# Patient Record
Sex: Female | Born: 1970 | Race: White | Hispanic: No | Marital: Married | Smoking: Former smoker
Health system: Southern US, Community
[De-identification: ages and names within clinical notes are randomized; demographics above are authoritative.]

## PROBLEM LIST (undated history)

## (undated) DIAGNOSIS — R5383 Other fatigue: Secondary | ICD-10-CM

## (undated) DIAGNOSIS — N941 Unspecified dyspareunia: Secondary | ICD-10-CM

## (undated) DIAGNOSIS — N6310 Unspecified lump in the right breast, unspecified quadrant: Secondary | ICD-10-CM

## (undated) DIAGNOSIS — M545 Low back pain: Secondary | ICD-10-CM

## (undated) DIAGNOSIS — K589 Irritable bowel syndrome without diarrhea: Secondary | ICD-10-CM

## (undated) HISTORY — DX: Low back pain: M54.5

## (undated) HISTORY — DX: Irritable bowel syndrome, unspecified: K58.9

## (undated) HISTORY — DX: Unspecified lump in the right breast, unspecified quadrant: N63.10

## (undated) HISTORY — DX: Unspecified dyspareunia: N94.10

## (undated) HISTORY — DX: Other fatigue: R53.83

---

## 2001-02-18 ENCOUNTER — Other Ambulatory Visit: Admission: RE | Admit: 2001-02-18 | Discharge: 2001-02-18 | Payer: Self-pay | Admitting: Obstetrics and Gynecology

## 2004-04-07 ENCOUNTER — Ambulatory Visit (HOSPITAL_COMMUNITY): Admission: RE | Admit: 2004-04-07 | Discharge: 2004-04-07 | Payer: Self-pay | Admitting: Obstetrics and Gynecology

## 2004-04-18 ENCOUNTER — Ambulatory Visit (HOSPITAL_COMMUNITY): Admission: RE | Admit: 2004-04-18 | Discharge: 2004-04-18 | Payer: Self-pay | Admitting: Obstetrics & Gynecology

## 2004-04-20 ENCOUNTER — Ambulatory Visit (HOSPITAL_COMMUNITY): Admission: RE | Admit: 2004-04-20 | Discharge: 2004-04-20 | Payer: Self-pay | Admitting: General Surgery

## 2004-07-24 HISTORY — PX: LAPAROSCOPIC CHOLECYSTECTOMY: SUR755

## 2006-04-16 ENCOUNTER — Ambulatory Visit: Payer: Self-pay | Admitting: Gastroenterology

## 2006-04-26 ENCOUNTER — Encounter (HOSPITAL_COMMUNITY): Admission: RE | Admit: 2006-04-26 | Discharge: 2006-05-26 | Payer: Self-pay | Admitting: Gastroenterology

## 2006-05-17 ENCOUNTER — Ambulatory Visit: Payer: Self-pay | Admitting: Gastroenterology

## 2006-05-17 ENCOUNTER — Ambulatory Visit (HOSPITAL_COMMUNITY): Admission: RE | Admit: 2006-05-17 | Discharge: 2006-05-17 | Payer: Self-pay | Admitting: Gastroenterology

## 2006-05-29 ENCOUNTER — Ambulatory Visit (HOSPITAL_COMMUNITY): Admission: RE | Admit: 2006-05-29 | Discharge: 2006-05-29 | Payer: Self-pay | Admitting: Gastroenterology

## 2006-05-29 ENCOUNTER — Ambulatory Visit: Payer: Self-pay | Admitting: Gastroenterology

## 2010-08-14 ENCOUNTER — Encounter: Payer: Self-pay | Admitting: Obstetrics & Gynecology

## 2010-10-04 ENCOUNTER — Other Ambulatory Visit (HOSPITAL_COMMUNITY)
Admission: RE | Admit: 2010-10-04 | Discharge: 2010-10-04 | Disposition: A | Discharge status: Home / Self Care | Payer: BC Managed Care – PPO | Source: Ambulatory Visit | Attending: Obstetrics and Gynecology | Admitting: Obstetrics and Gynecology

## 2010-10-04 ENCOUNTER — Other Ambulatory Visit: Payer: Self-pay | Admitting: Adult Health

## 2010-10-04 ENCOUNTER — Other Ambulatory Visit: Payer: Self-pay | Admitting: Obstetrics & Gynecology

## 2010-10-04 DIAGNOSIS — N6310 Unspecified lump in the right breast, unspecified quadrant: Secondary | ICD-10-CM

## 2010-10-04 DIAGNOSIS — Z01419 Encounter for gynecological examination (general) (routine) without abnormal findings: Secondary | ICD-10-CM | POA: Insufficient documentation

## 2010-10-12 ENCOUNTER — Other Ambulatory Visit: Payer: Self-pay | Admitting: Obstetrics & Gynecology

## 2010-10-12 ENCOUNTER — Ambulatory Visit (HOSPITAL_COMMUNITY)
Admission: RE | Admit: 2010-10-12 | Discharge: 2010-10-12 | Disposition: A | Discharge status: Home / Self Care | Payer: BC Managed Care – PPO | Source: Ambulatory Visit | Attending: Obstetrics & Gynecology | Admitting: Obstetrics & Gynecology

## 2010-10-12 DIAGNOSIS — N6310 Unspecified lump in the right breast, unspecified quadrant: Secondary | ICD-10-CM

## 2010-10-12 DIAGNOSIS — N63 Unspecified lump in unspecified breast: Secondary | ICD-10-CM | POA: Insufficient documentation

## 2010-11-11 ENCOUNTER — Other Ambulatory Visit (HOSPITAL_COMMUNITY): Payer: Self-pay | Admitting: Pediatrics

## 2010-11-11 ENCOUNTER — Ambulatory Visit (HOSPITAL_COMMUNITY)
Admission: RE | Admit: 2010-11-11 | Discharge: 2010-11-11 | Disposition: A | Discharge status: Home / Self Care | Payer: BC Managed Care – PPO | Source: Ambulatory Visit | Attending: Pediatrics | Admitting: Pediatrics

## 2010-11-11 DIAGNOSIS — R1031 Right lower quadrant pain: Secondary | ICD-10-CM | POA: Insufficient documentation

## 2010-11-11 DIAGNOSIS — R9389 Abnormal findings on diagnostic imaging of other specified body structures: Secondary | ICD-10-CM | POA: Insufficient documentation

## 2010-11-11 DIAGNOSIS — R3129 Other microscopic hematuria: Secondary | ICD-10-CM | POA: Insufficient documentation

## 2010-11-16 ENCOUNTER — Other Ambulatory Visit (HOSPITAL_COMMUNITY): Payer: Self-pay | Admitting: Pediatrics

## 2010-11-16 DIAGNOSIS — IMO0002 Reserved for concepts with insufficient information to code with codable children: Secondary | ICD-10-CM

## 2010-11-17 ENCOUNTER — Other Ambulatory Visit (HOSPITAL_COMMUNITY): Payer: Self-pay | Admitting: Pediatrics

## 2010-11-17 ENCOUNTER — Ambulatory Visit (HOSPITAL_COMMUNITY)
Admission: RE | Admit: 2010-11-17 | Discharge: 2010-11-17 | Disposition: A | Discharge status: Home / Self Care | Payer: BC Managed Care – PPO | Source: Ambulatory Visit | Attending: Pediatrics | Admitting: Pediatrics

## 2010-11-17 DIAGNOSIS — R1903 Right lower quadrant abdominal swelling, mass and lump: Secondary | ICD-10-CM | POA: Insufficient documentation

## 2010-11-17 DIAGNOSIS — IMO0002 Reserved for concepts with insufficient information to code with codable children: Secondary | ICD-10-CM

## 2010-11-17 DIAGNOSIS — R9389 Abnormal findings on diagnostic imaging of other specified body structures: Secondary | ICD-10-CM | POA: Insufficient documentation

## 2010-12-02 ENCOUNTER — Other Ambulatory Visit (HOSPITAL_COMMUNITY): Payer: BC Managed Care – PPO

## 2010-12-05 ENCOUNTER — Other Ambulatory Visit: Payer: Self-pay | Admitting: Obstetrics & Gynecology

## 2010-12-05 ENCOUNTER — Encounter (HOSPITAL_COMMUNITY): Payer: BC Managed Care – PPO

## 2010-12-05 LAB — COMPREHENSIVE METABOLIC PANEL
Alkaline Phosphatase: 63 U/L (ref 39–117)
BUN: 11 mg/dL (ref 6–23)
Creatinine, Ser: 0.58 mg/dL (ref 0.4–1.2)
Glucose, Bld: 88 mg/dL (ref 70–99)
Potassium: 4.3 mEq/L (ref 3.5–5.1)
Total Bilirubin: 0.4 mg/dL (ref 0.3–1.2)
Total Protein: 7.1 g/dL (ref 6.0–8.3)

## 2010-12-05 LAB — CBC
MCV: 88.9 fL (ref 78.0–100.0)
Platelets: 159 10*3/uL (ref 150–400)
RBC: 4.07 MIL/uL (ref 3.87–5.11)
RDW: 12.3 % (ref 11.5–15.5)
WBC: 5.3 10*3/uL (ref 4.0–10.5)

## 2010-12-05 LAB — URINALYSIS, ROUTINE W REFLEX MICROSCOPIC
Bilirubin Urine: NEGATIVE
Nitrite: NEGATIVE
Specific Gravity, Urine: 1.015 (ref 1.005–1.030)
Urobilinogen, UA: 0.2 mg/dL (ref 0.0–1.0)
pH: 6.5 (ref 5.0–8.0)

## 2010-12-05 LAB — SURGICAL PCR SCREEN
MRSA, PCR: NEGATIVE
Staphylococcus aureus: NEGATIVE

## 2010-12-05 LAB — URINE MICROSCOPIC-ADD ON

## 2010-12-05 LAB — HCG, QUANTITATIVE, PREGNANCY: hCG, Beta Chain, Quant, S: 1 m[IU]/mL (ref <5)

## 2010-12-05 LAB — TYPE AND SCREEN: Antibody Screen: NEGATIVE

## 2010-12-06 ENCOUNTER — Encounter: Payer: Self-pay | Admitting: Obstetrics & Gynecology

## 2010-12-06 DIAGNOSIS — N9489 Other specified conditions associated with female genital organs and menstrual cycle: Secondary | ICD-10-CM | POA: Insufficient documentation

## 2010-12-06 NOTE — Progress Notes (Signed)
Subjective:     Patient ID: Leslie Martin, female   DOB: 03-Sep-1970, 40 y.o.   MRN: [id number]  Pelvic Pain The patient's primary symptoms include pelvic pain. The patient's pertinent negatives include no genital itching, genital lesions, genital odor, genital rash, missed menses, vaginal bleeding or vaginal discharge. This is a new problem. The current episode started more than 1 month ago. The problem occurs intermittently. The problem has been waxing and waning. The pain is moderate. The problem affects the right side. She is not pregnant. Associated symptoms include abdominal pain. Pertinent negatives include no constipation, diarrhea, dysuria, flank pain, frequency, hematuria, nausea, urgency or vomiting.  Patient had a CT scan and then a pelvic transvaginal sonogram which reveals a 9cm complex adnexal mass, probably ovarian in origin, although could be a pedunculated myoma vs an endometrioma or a serous cystadenocarcinoma.  Ca125 was elevated at 125.6  As a result she is admitted for laparoscopic evaluation of this adnexal mass with appropriate intraoperative management.  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   Review of Systems  Constitutional: Negative.   HENT: Negative.   Eyes: Negative.   Respiratory: Negative.   Cardiovascular: Negative.   Gastrointestinal: Positive for abdominal pain. Negative for nausea, vomiting, diarrhea, constipation, blood in stool, abdominal distention, anal bleeding and rectal pain.  Genitourinary: Positive for pelvic pain. Negative for dysuria, urgency, frequency, hematuria, flank pain, decreased urine volume, vaginal bleeding, vaginal discharge, enuresis, difficulty urinating, genital sores, vaginal pain, menstrual problem, dyspareunia and missed menses.  Musculoskeletal: Negative.   Skin: Negative.   Neurological: Negative.   Hematological: Negative.   Psychiatric/Behavioral:  Negative.        Objective:   Physical Exam  Vitals reviewed. Constitutional: She is oriented to person, place, and time. She appears well-developed and well-nourished.  HENT:  Head: Normocephalic and atraumatic.  Neck: Normal range of motion. No thyromegaly present.  Cardiovascular: Normal rate, regular rhythm, normal heart sounds and intact distal pulses.   Pulmonary/Chest: Effort normal and breath sounds normal.  Abdominal: Soft. Bowel sounds are normal. She exhibits no distension and no mass. There is tenderness. There is no rebound and no guarding.  Genitourinary: Vagina normal and uterus normal. No vaginal discharge found.       On pelvic the mass is palpable on the right but is mobile and minimally tender.  Musculoskeletal: Normal range of motion.  Lymphadenopathy:    She has no cervical adenopathy.  Neurological: She is alert and oriented to person, place, and time.  Skin: Skin is warm and dry.  Psychiatric: She has a normal mood and affect. Her behavior is normal. Judgment and thought content normal.       Assessment:     Right Adnexal mass, complex with slightly elevated ca125 Desires permanent sterilization Hymenal remnant abnormality     Plan:    Laparoscopic Right salpingoophorectomy, possible LSH/BSO, left partial salpingectomy, surgical repair of hymenal remnant

## 2010-12-07 ENCOUNTER — Ambulatory Visit (HOSPITAL_COMMUNITY)
Admission: RE | Admit: 2010-12-07 | Discharge: 2010-12-08 | Disposition: A | Discharge status: Home / Self Care | Payer: BC Managed Care – PPO | Source: Ambulatory Visit | Attending: Obstetrics & Gynecology | Admitting: Obstetrics & Gynecology

## 2010-12-07 ENCOUNTER — Other Ambulatory Visit: Payer: Self-pay | Admitting: Obstetrics & Gynecology

## 2010-12-07 DIAGNOSIS — N801 Endometriosis of ovary: Secondary | ICD-10-CM | POA: Insufficient documentation

## 2010-12-07 DIAGNOSIS — N80109 Endometriosis of ovary, unspecified side, unspecified depth: Secondary | ICD-10-CM | POA: Insufficient documentation

## 2010-12-07 DIAGNOSIS — N803 Endometriosis of pelvic peritoneum, unspecified: Secondary | ICD-10-CM | POA: Insufficient documentation

## 2010-12-07 DIAGNOSIS — Z01812 Encounter for preprocedural laboratory examination: Secondary | ICD-10-CM | POA: Insufficient documentation

## 2010-12-07 DIAGNOSIS — N809 Endometriosis, unspecified: Secondary | ICD-10-CM | POA: Insufficient documentation

## 2010-12-07 HISTORY — PX: LAPAROSCOPIC SALPINGOOPHERECTOMY: SUR795

## 2010-12-07 HISTORY — PX: LAPAROSCOPIC SUPRACERVICAL HYSTERECTOMY: SHX5399

## 2010-12-14 ENCOUNTER — Encounter: Payer: Self-pay | Admitting: Obstetrics & Gynecology

## 2010-12-14 NOTE — Progress Notes (Signed)
  Leslie Martin comes in today for a postoperative appointment. She is without complaints her pain is well controlled her incisions are clean dry and intact all staples were removed. Her hemoglobin is 12  She is begun on Megace for treatment of the endometriosis. Otherwise she is given normal instructions.  I will see her back in 3 weeks

## 2010-12-25 ENCOUNTER — Emergency Department (HOSPITAL_COMMUNITY)
Admission: EM | Admit: 2010-12-25 | Discharge: 2010-12-26 | Disposition: A | Discharge status: Home / Self Care | Payer: BC Managed Care – PPO | Attending: Emergency Medicine | Admitting: Emergency Medicine

## 2010-12-25 DIAGNOSIS — B9789 Other viral agents as the cause of diseases classified elsewhere: Secondary | ICD-10-CM | POA: Insufficient documentation

## 2010-12-25 DIAGNOSIS — N390 Urinary tract infection, site not specified: Secondary | ICD-10-CM | POA: Insufficient documentation

## 2010-12-25 DIAGNOSIS — IMO0001 Reserved for inherently not codable concepts without codable children: Secondary | ICD-10-CM | POA: Insufficient documentation

## 2010-12-25 DIAGNOSIS — R197 Diarrhea, unspecified: Secondary | ICD-10-CM | POA: Insufficient documentation

## 2010-12-25 DIAGNOSIS — R112 Nausea with vomiting, unspecified: Secondary | ICD-10-CM | POA: Insufficient documentation

## 2010-12-25 LAB — BASIC METABOLIC PANEL
GFR calc Af Amer: >60 mL/min (ref >60)
GFR calc non Af Amer: >60 mL/min (ref >60)
Glucose, Bld: 109 mg/dL — ABNORMAL HIGH (ref 70–99)
Potassium: 3.8 mEq/L (ref 3.5–5.1)
Sodium: 137 mEq/L (ref 135–145)

## 2010-12-25 LAB — DIFFERENTIAL
Basophils Absolute: 0 10*3/uL (ref 0.0–0.1)
Basophils Relative: 1 % (ref 0–1)
Lymphocytes Relative: 13 % (ref 12–46)
Neutro Abs: 4.8 10*3/uL (ref 1.7–7.7)

## 2010-12-25 LAB — CBC
HCT: 36.5 % (ref 36.0–46.0)
Hemoglobin: 12.4 g/dL (ref 12.0–15.0)
RDW: 12.6 % (ref 11.5–15.5)
WBC: 6.3 10*3/uL (ref 4.0–10.5)

## 2010-12-25 LAB — URINE MICROSCOPIC-ADD ON

## 2010-12-25 LAB — URINALYSIS, ROUTINE W REFLEX MICROSCOPIC
Bilirubin Urine: NEGATIVE
Glucose, UA: NEGATIVE mg/dL
Ketones, ur: NEGATIVE mg/dL
pH: 6 (ref 5.0–8.0)

## 2011-01-10 ENCOUNTER — Encounter: Payer: Self-pay | Admitting: Obstetrics & Gynecology

## 2011-01-10 NOTE — Progress Notes (Signed)
  Leslie Martin is in today for a post operative visit.  she is without complaint. She is having some temperature intolerance and some moments of increased internal anxiety. However she would like to avoid any anxiety medicine or depression medicine. We will plan to continue her iron megestrol only for a period of 3 months postoperatively. she is aware of the reasons behind this. The plan for right now is to begin fashion therapy in 2 months  ON physical exam the abdomen is benign, all 3 incisions are well healed and pelvic exam is benign.  We will see Lagretta back in 2 months to start estrogen therapy.

## 2011-01-27 NOTE — Op Note (Signed)
[name], Leslie NO.:  [id number]  MEDICAL RECORD NO.:  [id number]  LOCATION:                                 FACILITY:  PHYSICIAN:  Lazaro Arms, M.D.   DATE OF BIRTH:  May 14, 1971  DATE OF PROCEDURE:  12/09/2010 DATE OF DISCHARGE:                              OPERATIVE REPORT   PREOPERATIVE DIAGNOSES: 1. Right adnexal mass. 2. Elevated CA-125.  POSTOPERATIVE DIAGNOSES: 1. Right adnexal mass. 2. Elevated CA-125. 3. 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  PROCEDURES: 1. Laparoscopic supracervical hysterectomy with bilateral salpingo-     oophorectomy. 2. Excision of hymenal remnant.  SURGEON:  Lazaro Arms, MD  ANESTHETIC:  General endotracheal.  FINDINGS:  The patient was known to have a large right adnexal mass with a CA-125 which was slightly elevated in the 40s, but felt most likely to still be benign, proceeded with laparoscopy today, and found a large endometrioma with pelvic endometriosis, dramatic amount and as a result proceeded as we talked about in the office with a laparoscopic supracervical hysterectomy and BSO.  DESCRIPTION OF OPERATION:  The patient taken to the operating room, and placed in the supine position where she underwent general endotracheal anesthesia.  She was then placed in dorsal spine position, prepped and draped in usual sterile fashion.  Incision was made in the umbilicus, carried down to the fascia.  Veress needle was used, placed in the peritoneal cavity.  The peritoneal cavity was insufflated.  A laparoscope, non bladed trocar was used.  A video laparoscope was placed, one pass without difficulty.  Then additional trocars were placed in the right and left lower quadrant again under direct visualization without difficulty.  I then found the severe endometriosis with a large endometrium of the right ovary to the great deal of time to dissect the endometrium off the  pelvic sidewall, ensuring not injuring the ureter.  This was done meticulously with the Harmonic scalpel and with bunt dissection.  I then took down the infundibulopelvic ligament with Harmonic scalpel and excised it from the ovary, again used the Harmonic scalpel.  I then incise the right round ligament, took down the bladder flap on that side, took down the right round ligament on the left side Harmonic.  The utero-ovarian ligament was well skeletonized, the uterine vessels bilaterally and the vesicouterine serosal flap on the left, coagulated the utero-ovarian vessels on the left as well.  I then coagulated the both uterine vessels with good hemostasis achieved. Two pedicles were taken down the cardinal ligament passed the uterine vessels, stay medially, and then Harmonic scalpel was used to transect across the cervix, removed specimen.  The morcellator was then placed in the left lower quadrant and the uterus and ovaries endometrium were taken down the strips.  Pelvis irrigated vigorously.  Good hemostasis was achieved in all pedicles, and again all pelvic endometriosis was ablated.  The instruments were removed.  The fascia x3 was closed. Incisions x3 which were closed.  The patient was awakened from anesthesia, and taken to recovery room in stable condition.  All counts correct.  She received  Ancef and Toradol prophylactically.     Lazaro Arms, M.D.     Loraine Maple  D:  01/24/2011  T:  01/25/2011  Job:  [id number]  Electronically Signed by Duane Lope M.D. on 01/27/2011 12:23:26 PM

## 2012-09-26 ENCOUNTER — Other Ambulatory Visit (HOSPITAL_COMMUNITY)
Admission: RE | Admit: 2012-09-26 | Discharge: 2012-09-26 | Disposition: A | Discharge status: Home / Self Care | Payer: BC Managed Care – PPO | Source: Ambulatory Visit | Attending: Obstetrics and Gynecology | Admitting: Obstetrics and Gynecology

## 2012-09-26 ENCOUNTER — Other Ambulatory Visit: Payer: Self-pay | Admitting: Adult Health

## 2012-09-26 DIAGNOSIS — Z01419 Encounter for gynecological examination (general) (routine) without abnormal findings: Secondary | ICD-10-CM | POA: Insufficient documentation

## 2012-09-26 DIAGNOSIS — Z1151 Encounter for screening for human papillomavirus (HPV): Secondary | ICD-10-CM | POA: Insufficient documentation

## 2012-10-24 ENCOUNTER — Telehealth: Payer: Self-pay | Admitting: Adult Health

## 2012-10-24 DIAGNOSIS — Z9223 Personal history of estrogen therapy: Secondary | ICD-10-CM

## 2012-10-24 MED ORDER — ESTRADIOL 0.52 MG/0.87 GM (0.06%) TD GEL: 1.0000 "application " | Freq: Every day | TRANSDERMAL | Status: DC

## 2012-10-24 NOTE — Telephone Encounter (Signed)
Left message that meds had been called to [facility].

## 2013-11-04 ENCOUNTER — Telehealth: Payer: Self-pay | Admitting: Adult Health

## 2013-11-04 NOTE — Telephone Encounter (Signed)
Spoke with pt. Pt states that [facility] told her that Elestrin was being discontinued. Victorino DikeJennifer advised to try another pharmacy and see if someone else has med. Pt voiced understanding. JSY

## 2013-11-06 ENCOUNTER — Telehealth: Payer: Self-pay | Admitting: *Deleted

## 2013-11-06 NOTE — Telephone Encounter (Signed)
Spoke with pt. She wanted Elestrin called into [facility] in [place] because it was [facility] at [facility] [place]. I called in Estradiol (Elestrin) 0.52 mg/0.87 gm (0.06%) gel # 1 bottle apply 1 application topically daily with 11 refills per Cyril MourningJennifer Griffin, NP. Left message letting pt know.  JSY

## 2014-05-25 ENCOUNTER — Encounter: Payer: Self-pay | Admitting: Obstetrics & Gynecology

## 2014-11-09 ENCOUNTER — Other Ambulatory Visit: Payer: Self-pay | Admitting: Adult Health

## 2014-11-18 ENCOUNTER — Ambulatory Visit (INDEPENDENT_AMBULATORY_CARE_PROVIDER_SITE_OTHER): Payer: BLUE CROSS/BLUE SHIELD | Admitting: Adult Health

## 2014-11-18 ENCOUNTER — Encounter: Payer: Self-pay | Admitting: Adult Health

## 2014-11-18 VITALS — BP 128/78 | HR 64 | Ht 61.25 in | Wt 156.0 lb

## 2014-11-18 DIAGNOSIS — R5383 Other fatigue: Secondary | ICD-10-CM

## 2014-11-18 DIAGNOSIS — Z1212 Encounter for screening for malignant neoplasm of rectum: Secondary | ICD-10-CM

## 2014-11-18 DIAGNOSIS — Z79899 Other long term (current) drug therapy: Secondary | ICD-10-CM | POA: Insufficient documentation

## 2014-11-18 DIAGNOSIS — Z01419 Encounter for gynecological examination (general) (routine) without abnormal findings: Secondary | ICD-10-CM | POA: Diagnosis not present

## 2014-11-18 DIAGNOSIS — N6310 Unspecified lump in the right breast, unspecified quadrant: Secondary | ICD-10-CM

## 2014-11-18 DIAGNOSIS — Z79818 Long term (current) use of other agents affecting estrogen receptors and estrogen levels: Secondary | ICD-10-CM

## 2014-11-18 HISTORY — DX: Unspecified lump in the right breast, unspecified quadrant: N63.10

## 2014-11-18 HISTORY — DX: Other fatigue: R53.83

## 2014-11-18 LAB — HEMOCCULT GUIAC POC 1CARD (OFFICE): FECAL OCCULT BLD: NEGATIVE

## 2014-11-18 MED ORDER — ESTRADIOL 1 MG PO TABS: 1.0000 mg | ORAL_TABLET | Freq: Every day | ORAL | Status: DC

## 2014-11-18 NOTE — Progress Notes (Signed)
Patient ID: Leslie Martin, female   DOB: 10-25-1970, 44 y.o.   MRN: [id number] History of Present Illness: Leslie Martin is a 44 year old white female,married in for well woman gyn exam.She had a normal pap with negative HPV 09/26/12.   Current Medications, Allergies, Past Medical History, Past Surgical History, Family History and Social History were reviewed in [facility] electronic medical record.     Review of Systems: Patient denies any headaches, hearing loss, blurred vision, shortness of breath, chest pain, abdominal pain, problems with bowel movements, urination, or intercourse. No joint pain or mood swings. She complains of some fatigue and has had spotting after sex and some pelvic pain and looser BMs, has IBS and has been eating more fruit.Does not feel estro gel working as well.Has noticed right breast sore.   Physical Exam:BP 128/78 mmHg  Pulse 64  Ht 5' 1.25" (1.556 m)  Wt 156 lb (70.761 kg)  BMI 29.23 kg/m2  LMP 05/16/2012She is sp supracervical hyst. General:  Well developed, well nourished, no acute distress Skin:  Warm and dry Neck:  Midline trachea, normal thyroid, good ROM, no lymphadenopathy Lungs; Clear to auscultation bilaterally Breast:  No dominant palpable mass, retraction, or nipple discharge on left, on right, no retraction or nipple discharge but there is a round 2 x 2 cm mass at 11 0;clock that is tender and she says it seems to come and go Cardiovascular: Regular rate and rhythm Abdomen:  Soft, non tender, no hepatosplenomegaly Pelvic:  External genitalia is normal in appearance, no lesions.  The vagina is normal in appearance. Urethra has no lesions or masses. The cervix is bulbous and smooth.  Uterus is absent.  No adnexal masses or tenderness noted.Bladder is non tender, no masses felt. Rectal: Good sphincter tone, no polyps, or hemorrhoids felt.  Hemoccult negative. Extremities/musculoskeletal:  No swelling or varicosities noted, no clubbing or cyanosis Psych:   No mood changes, alert and cooperative,seems happy Discussed trying oral estrogen to see if feels better.  Impression: Well woman gyn exam no pap Right breast mass ET Fatigue     Plan: Rx estrace 1 mg #30 1 daily with 11 refills Check CBC,CMP,TSH and lipids, will talk when labs back Diagnostic bilateral mammogram and right breast [facility] Change positions with sex Decrease fruit and see how stomach feels

## 2014-11-19 ENCOUNTER — Telehealth: Payer: Self-pay | Admitting: Adult Health

## 2014-11-19 LAB — COMPREHENSIVE METABOLIC PANEL
ALBUMIN: 4.5 g/dL (ref 3.5–5.5)
ALT: 14 IU/L (ref 0–32)
AST: 15 IU/L (ref 0–40)
Albumin/Globulin Ratio: 1.4 (ref 1.1–2.5)
Alkaline Phosphatase: 74 IU/L (ref 39–117)
BUN/Creatinine Ratio: 13 (ref 9–23)
BUN: 11 mg/dL (ref 6–24)
Bilirubin Total: 0.5 mg/dL (ref 0.0–1.2)
CALCIUM: 9.2 mg/dL (ref 8.7–10.2)
CHLORIDE: 102 mmol/L (ref 97–108)
CO2: 22 mmol/L (ref 18–29)
CREATININE: 0.82 mg/dL (ref 0.57–1.00)
GFR calc Af Amer: 101 mL/min/{1.73_m2} (ref >59)
GFR calc non Af Amer: 88 mL/min/{1.73_m2} (ref >59)
GLOBULIN, TOTAL: 3.2 g/dL (ref 1.5–4.5)
Glucose: 90 mg/dL (ref 65–99)
Potassium: 4.4 mmol/L (ref 3.5–5.2)
Sodium: 140 mmol/L (ref 134–144)
TOTAL PROTEIN: 7.7 g/dL (ref 6.0–8.5)

## 2014-11-19 LAB — LIPID PANEL
CHOL/HDL RATIO: 2.5 ratio (ref 0.0–4.4)
Cholesterol, Total: 182 mg/dL (ref 100–199)
HDL: 73 mg/dL (ref >39)
LDL CALC: 99 mg/dL (ref 0–99)
Triglycerides: 52 mg/dL (ref 0–149)
VLDL Cholesterol Cal: 10 mg/dL (ref 5–40)

## 2014-11-19 LAB — CBC
HEMATOCRIT: 39.7 % (ref 34.0–46.6)
HEMOGLOBIN: 14.1 g/dL (ref 11.1–15.9)
MCH: 31.3 pg (ref 26.6–33.0)
MCHC: 35.5 g/dL (ref 31.5–35.7)
MCV: 88 fL (ref 79–97)
Platelets: 223 10*3/uL (ref 150–379)
RBC: 4.51 x10E6/uL (ref 3.77–5.28)
RDW: 13.1 % (ref 12.3–15.4)
WBC: 4.6 10*3/uL (ref 3.4–10.8)

## 2014-11-19 LAB — TSH: TSH: 2.92 u[IU]/mL (ref 0.450–4.500)

## 2014-11-19 NOTE — Telephone Encounter (Signed)
Left message labs are excellent

## 2014-12-01 ENCOUNTER — Other Ambulatory Visit: Payer: Self-pay | Admitting: Adult Health

## 2014-12-01 ENCOUNTER — Ambulatory Visit (HOSPITAL_COMMUNITY)
Admission: RE | Admit: 2014-12-01 | Discharge: 2014-12-01 | Disposition: A | Discharge status: Home / Self Care | Payer: BLUE CROSS/BLUE SHIELD | Source: Ambulatory Visit | Attending: Adult Health | Admitting: Adult Health

## 2014-12-01 DIAGNOSIS — N63 Unspecified lump in breast: Secondary | ICD-10-CM | POA: Diagnosis present

## 2014-12-01 DIAGNOSIS — N6310 Unspecified lump in the right breast, unspecified quadrant: Secondary | ICD-10-CM

## 2014-12-01 DIAGNOSIS — N6320 Unspecified lump in the left breast, unspecified quadrant: Secondary | ICD-10-CM

## 2014-12-11 ENCOUNTER — Other Ambulatory Visit: Payer: Self-pay | Admitting: Adult Health

## 2014-12-11 ENCOUNTER — Other Ambulatory Visit: Payer: Self-pay

## 2014-12-11 DIAGNOSIS — N6310 Unspecified lump in the right breast, unspecified quadrant: Secondary | ICD-10-CM

## 2014-12-15 ENCOUNTER — Ambulatory Visit
Admission: RE | Admit: 2014-12-15 | Discharge: 2014-12-15 | Disposition: A | Discharge status: Home / Self Care | Payer: BLUE CROSS/BLUE SHIELD | Source: Ambulatory Visit | Attending: Adult Health | Admitting: Adult Health

## 2014-12-15 DIAGNOSIS — N6310 Unspecified lump in the right breast, unspecified quadrant: Secondary | ICD-10-CM

## 2015-05-21 ENCOUNTER — Telehealth: Payer: Self-pay | Admitting: Adult Health

## 2015-05-21 NOTE — Telephone Encounter (Signed)
Will come by Monday to have form for insurance filled out

## 2015-11-29 ENCOUNTER — Telehealth: Payer: Self-pay | Admitting: Adult Health

## 2015-11-29 ENCOUNTER — Encounter: Payer: Self-pay | Admitting: Adult Health

## 2015-11-29 ENCOUNTER — Ambulatory Visit (INDEPENDENT_AMBULATORY_CARE_PROVIDER_SITE_OTHER): Payer: BLUE CROSS/BLUE SHIELD | Admitting: Adult Health

## 2015-11-29 ENCOUNTER — Other Ambulatory Visit (HOSPITAL_COMMUNITY)
Admission: RE | Admit: 2015-11-29 | Discharge: 2015-11-29 | Disposition: A | Discharge status: Home / Self Care | Payer: BLUE CROSS/BLUE SHIELD | Source: Ambulatory Visit | Attending: Adult Health | Admitting: Adult Health

## 2015-11-29 VITALS — BP 130/80 | HR 60 | Ht 62.0 in | Wt 146.0 lb

## 2015-11-29 DIAGNOSIS — Z01411 Encounter for gynecological examination (general) (routine) with abnormal findings: Secondary | ICD-10-CM

## 2015-11-29 DIAGNOSIS — N6310 Unspecified lump in the right breast, unspecified quadrant: Secondary | ICD-10-CM

## 2015-11-29 DIAGNOSIS — Z1151 Encounter for screening for human papillomavirus (HPV): Secondary | ICD-10-CM | POA: Diagnosis not present

## 2015-11-29 DIAGNOSIS — M545 Low back pain, unspecified: Secondary | ICD-10-CM

## 2015-11-29 DIAGNOSIS — Z01419 Encounter for gynecological examination (general) (routine) without abnormal findings: Secondary | ICD-10-CM

## 2015-11-29 DIAGNOSIS — N941 Unspecified dyspareunia: Secondary | ICD-10-CM | POA: Diagnosis not present

## 2015-11-29 DIAGNOSIS — Z1212 Encounter for screening for malignant neoplasm of rectum: Secondary | ICD-10-CM | POA: Diagnosis not present

## 2015-11-29 DIAGNOSIS — Z79899 Other long term (current) drug therapy: Secondary | ICD-10-CM

## 2015-11-29 DIAGNOSIS — N63 Unspecified lump in breast: Secondary | ICD-10-CM

## 2015-11-29 HISTORY — DX: Low back pain, unspecified: M54.50

## 2015-11-29 HISTORY — DX: Unspecified dyspareunia: N94.10

## 2015-11-29 LAB — HEMOCCULT GUIAC POC 1CARD (OFFICE): Fecal Occult Blood, POC: NEGATIVE

## 2015-11-29 MED ORDER — EST ESTROGENS-METHYLTEST 0.625-1.25 MG PO TABS: 1.0000 | ORAL_TABLET | Freq: Every day | ORAL | Status: DC

## 2015-11-29 MED ORDER — EST ESTROGENS-METHYLTEST 1.25-2.5 MG PO TABS: 1.0000 | ORAL_TABLET | Freq: Every day | ORAL | Status: DC

## 2015-11-29 MED ORDER — CYCLOBENZAPRINE HCL 5 MG PO TABS: 5.0000 mg | ORAL_TABLET | Freq: Three times a day (TID) | ORAL | Status: DC | PRN

## 2015-11-29 NOTE — Telephone Encounter (Signed)
Insurance will not cover estratest HS will order estratest

## 2015-11-29 NOTE — Progress Notes (Signed)
Patient ID: Leslie Martin, female   DOB: 02-13-1971, 45 y.o.   MRN: [id number] History of Present Illness: Leslie Martin is a 45 year old white female, married in for well woman gyn exam and pap, she is having hot flashes some and not sleeping well and has pain with sex and low back, she is kick boxing but wonders if endometriosis coming back.Also it is hard to reach orgasm.   Current Medications, Allergies, Past Medical History, Past Surgical History, Family History and Social History were reviewed in [facility] electronic medical record.     Review of Systems:  Patient denies any daily headaches, hearing loss, fatigue, blurred vision, shortness of breath, chest pain, abdominal pain, problems with bowel movements, urination,. No joint pain or mood swings. See HPI for positives.  Physical Exam:BP 130/80 mmHg  Pulse 60  Ht 5\' 2"  (1.575 m)  Wt 146 lb (66.225 kg)  BMI 26.70 kg/m2  LMP 12/07/2010  General:  Well developed, well nourished, no acute distress Skin:  Warm and dry,no rashes Neck:  Midline trachea, normal thyroid, good ROM, no lymphadenopathy Lungs; Clear to auscultation bilaterally Breast:  No dominant palpable mass, retraction, or nipple discharge on the left, on right in UOQ 3 cm mobile mass, she says it has been there,no retraction or nipple discharge  Cardiovascular: Regular rate and rhythm Abdomen:  Soft, non tender, no hepatosplenomegaly,some tenderness on palpation left lower back Pelvic:  External genitalia is normal in appearance, no lesions.  The vagina is normal in appearance. Urethra has no lesions or masses. The cervix is bulbous.Pap with HPV performed.  Uterus is absent.  No adnexal masses or tenderness noted.Bladder is non tender, no masses felt. Rectal: Good sphincter tone, no polyps, or hemorrhoids felt.  Hemoccult negative. Extremities/musculoskeletal:  No swelling or varicosities noted, no clubbing or cyanosis Psych:  No mood changes, alert and cooperative,seems  happy Discussed trying estrogen and testosterone and she agrees,will get diagnostic mammogram.And wil try flexeril and ice for back.  Impression: Well woman gyn exam and pap Dyspareunia Right breast mass Back pain Estrogen therapy    Plan: Stop estrace  Rx estratest HS 1 daily #30 with 3 refills Rx flexeril 5 mg #30 take 1 tid prn and use ice Diagnostic bilateral mamm[facility] and right and [facility] Follow up in 6 weeks Physical in 1 year, pap in 3 if normal

## 2015-11-29 NOTE — Patient Instructions (Signed)
Mammogram 5/16 at 2:40 pm Follow up with me  Physical in 1 year, pap in 3 if normal Try flexeril and ice Change to estratest

## 2015-11-29 NOTE — Telephone Encounter (Signed)
Pt called stating that she just left the office and would like a call back from [place]ennifer, Pt did not state the reason why. Please contact pt

## 2015-11-29 NOTE — Telephone Encounter (Signed)
Left message I called 

## 2015-11-30 LAB — CYTOLOGY - PAP

## 2015-12-07 ENCOUNTER — Ambulatory Visit (HOSPITAL_COMMUNITY)
Admission: RE | Admit: 2015-12-07 | Discharge: 2015-12-07 | Disposition: A | Discharge status: Home / Self Care | Payer: BLUE CROSS/BLUE SHIELD | Source: Ambulatory Visit | Attending: Adult Health | Admitting: Adult Health

## 2015-12-07 DIAGNOSIS — N6310 Unspecified lump in the right breast, unspecified quadrant: Secondary | ICD-10-CM

## 2015-12-07 DIAGNOSIS — N63 Unspecified lump in breast: Secondary | ICD-10-CM | POA: Diagnosis not present

## 2015-12-07 DIAGNOSIS — N6489 Other specified disorders of breast: Secondary | ICD-10-CM | POA: Diagnosis not present

## 2015-12-07 DIAGNOSIS — R922 Inconclusive mammogram: Secondary | ICD-10-CM | POA: Diagnosis not present

## 2016-01-12 ENCOUNTER — Ambulatory Visit: Payer: BLUE CROSS/BLUE SHIELD | Admitting: Adult Health

## 2016-01-18 ENCOUNTER — Ambulatory Visit: Payer: BLUE CROSS/BLUE SHIELD | Admitting: Adult Health

## 2016-01-19 ENCOUNTER — Encounter: Payer: Self-pay | Admitting: Adult Health

## 2016-01-19 ENCOUNTER — Ambulatory Visit (INDEPENDENT_AMBULATORY_CARE_PROVIDER_SITE_OTHER): Payer: BLUE CROSS/BLUE SHIELD | Admitting: Adult Health

## 2016-01-19 VITALS — BP 120/70 | HR 68 | Ht 62.0 in | Wt 148.0 lb

## 2016-01-19 DIAGNOSIS — Z79899 Other long term (current) drug therapy: Secondary | ICD-10-CM

## 2016-01-19 DIAGNOSIS — N951 Menopausal and female climacteric states: Secondary | ICD-10-CM

## 2016-01-19 MED ORDER — EST ESTROGENS-METHYLTEST 1.25-2.5 MG PO TABS: 1.0000 | ORAL_TABLET | Freq: Every day | ORAL | Status: DC

## 2016-01-19 NOTE — Progress Notes (Signed)
Subjective:     Patient ID: Leslie Martin, female   DOB: 1971-02-02, 45 y.o.   MRN: [id number]  HPI Leslie Martin is a [age] year old white female back in follow up of starting estratest,feels better more like her self but face is breaking out and it is expensive, and has occasional headache.  Review of Systems  +headache at times +face breaking out Feels more like her self  Reviewed past medical,surgical, social and family history. Reviewed medications and allergies.     Objective:   Physical Exam BP 120/70 mmHg  Pulse 68  Ht 5\' 2"  (1.575 m)  Wt 148 lb (67.132 kg)  BMI 27.06 kg/m2  LMP 12/07/2010 Skin warm and dry. Lungs: clear to ausculation bilaterally. Cardiovascular: regular rate and rhythm.Will try full strength estratest,she has had Half strength     Assessment:     ET    Plan:     Rx estratest 1.25 mg -2.5 mg  #30t ake 1 daily with 5 refills   Will talk in 4-6 weeks Follow up prn

## 2016-01-19 NOTE — Patient Instructions (Signed)
Continue estra test  Follow up prn

## 2016-04-26 ENCOUNTER — Telehealth: Payer: Self-pay | Admitting: Adult Health

## 2016-04-26 MED ORDER — DOXYCYCLINE HYCLATE 50 MG PO CAPS: 50.0000 mg | ORAL_CAPSULE | Freq: Every day | ORAL | 1 refills | Status: DC

## 2016-04-26 NOTE — Telephone Encounter (Signed)
She is feeling great on estratest but is having facial acne, will rx doxycycline 50 mg daily and use toner

## 2016-04-26 NOTE — Telephone Encounter (Signed)
Spoke with pt. Pt is on Estratest and has been for 2 months. Pt feels great on med but is having bad facial breakouts. What do you advise? Thanks!! JSY

## 2016-04-26 NOTE — Telephone Encounter (Signed)
Left message x 1. JSY 

## 2016-05-02 DIAGNOSIS — S338XXA Sprain of other parts of lumbar spine and pelvis, initial encounter: Secondary | ICD-10-CM | POA: Diagnosis not present

## 2016-05-02 DIAGNOSIS — S134XXA Sprain of ligaments of cervical spine, initial encounter: Secondary | ICD-10-CM | POA: Diagnosis not present

## 2016-05-02 DIAGNOSIS — M546 Pain in thoracic spine: Secondary | ICD-10-CM | POA: Diagnosis not present

## 2016-05-04 DIAGNOSIS — M546 Pain in thoracic spine: Secondary | ICD-10-CM | POA: Diagnosis not present

## 2016-05-04 DIAGNOSIS — S134XXA Sprain of ligaments of cervical spine, initial encounter: Secondary | ICD-10-CM | POA: Diagnosis not present

## 2016-05-04 DIAGNOSIS — S338XXA Sprain of other parts of lumbar spine and pelvis, initial encounter: Secondary | ICD-10-CM | POA: Diagnosis not present

## 2016-05-25 DIAGNOSIS — M546 Pain in thoracic spine: Secondary | ICD-10-CM | POA: Diagnosis not present

## 2016-05-25 DIAGNOSIS — S338XXA Sprain of other parts of lumbar spine and pelvis, initial encounter: Secondary | ICD-10-CM | POA: Diagnosis not present

## 2016-05-25 DIAGNOSIS — S134XXA Sprain of ligaments of cervical spine, initial encounter: Secondary | ICD-10-CM | POA: Diagnosis not present

## 2016-06-22 DIAGNOSIS — S134XXA Sprain of ligaments of cervical spine, initial encounter: Secondary | ICD-10-CM | POA: Diagnosis not present

## 2016-06-22 DIAGNOSIS — S338XXA Sprain of other parts of lumbar spine and pelvis, initial encounter: Secondary | ICD-10-CM | POA: Diagnosis not present

## 2016-06-22 DIAGNOSIS — M546 Pain in thoracic spine: Secondary | ICD-10-CM | POA: Diagnosis not present

## 2016-07-10 DIAGNOSIS — S338XXA Sprain of other parts of lumbar spine and pelvis, initial encounter: Secondary | ICD-10-CM | POA: Diagnosis not present

## 2016-07-10 DIAGNOSIS — S134XXA Sprain of ligaments of cervical spine, initial encounter: Secondary | ICD-10-CM | POA: Diagnosis not present

## 2016-07-10 DIAGNOSIS — M546 Pain in thoracic spine: Secondary | ICD-10-CM | POA: Diagnosis not present

## 2016-07-22 ENCOUNTER — Other Ambulatory Visit: Payer: Self-pay | Admitting: Adult Health

## 2016-07-26 ENCOUNTER — Other Ambulatory Visit: Payer: Self-pay | Admitting: Adult Health

## 2016-07-31 ENCOUNTER — Telehealth: Payer: Self-pay | Admitting: Adult Health

## 2016-07-31 NOTE — Telephone Encounter (Signed)
Pt have acne with estratest, will take estrace most days and estratest like 2 x weekly and see if better

## 2016-07-31 NOTE — Telephone Encounter (Signed)
Pt called stating that she would like a call back from [place]Jennifer, Pt did not state why

## 2016-08-03 DIAGNOSIS — M546 Pain in thoracic spine: Secondary | ICD-10-CM | POA: Diagnosis not present

## 2016-08-03 DIAGNOSIS — S134XXA Sprain of ligaments of cervical spine, initial encounter: Secondary | ICD-10-CM | POA: Diagnosis not present

## 2016-08-03 DIAGNOSIS — S338XXA Sprain of other parts of lumbar spine and pelvis, initial encounter: Secondary | ICD-10-CM | POA: Diagnosis not present

## 2016-08-28 ENCOUNTER — Telehealth: Payer: Self-pay | Admitting: Internal Medicine

## 2016-08-28 NOTE — Telephone Encounter (Signed)
Spouse is current patient and Dr. Yetta BarreJones just agreed to take son Bing NeighborsColton on as patient.  [name] would like to know if Dr. Yetta BarreJones would take her on as patient too?

## 2016-08-29 NOTE — Telephone Encounter (Signed)
Left vm to schedule appt

## 2016-08-29 NOTE — Telephone Encounter (Signed)
yes

## 2016-09-13 ENCOUNTER — Other Ambulatory Visit (INDEPENDENT_AMBULATORY_CARE_PROVIDER_SITE_OTHER): Payer: BLUE CROSS/BLUE SHIELD

## 2016-09-13 ENCOUNTER — Encounter: Payer: Self-pay | Admitting: Internal Medicine

## 2016-09-13 ENCOUNTER — Ambulatory Visit (INDEPENDENT_AMBULATORY_CARE_PROVIDER_SITE_OTHER): Payer: BLUE CROSS/BLUE SHIELD | Admitting: Internal Medicine

## 2016-09-13 VITALS — BP 138/78 | HR 69 | Temp 97.9°F | Resp 16 | Ht 62.0 in | Wt 152.8 lb

## 2016-09-13 DIAGNOSIS — Z0000 Encounter for general adult medical examination without abnormal findings: Secondary | ICD-10-CM

## 2016-09-13 DIAGNOSIS — Z23 Encounter for immunization: Secondary | ICD-10-CM | POA: Diagnosis not present

## 2016-09-13 DIAGNOSIS — Z01419 Encounter for gynecological examination (general) (routine) without abnormal findings: Secondary | ICD-10-CM | POA: Insufficient documentation

## 2016-09-13 LAB — COMPREHENSIVE METABOLIC PANEL
ALT: 14 U/L (ref 0–35)
AST: 17 U/L (ref 0–37)
Albumin: 4.3 g/dL (ref 3.5–5.2)
Alkaline Phosphatase: 51 U/L (ref 39–117)
BILIRUBIN TOTAL: 0.3 mg/dL (ref 0.2–1.2)
BUN: 13 mg/dL (ref 6–23)
CHLORIDE: 106 meq/L (ref 96–112)
CO2: 28 meq/L (ref 19–32)
CREATININE: 0.77 mg/dL (ref 0.40–1.20)
Calcium: 9.3 mg/dL (ref 8.4–10.5)
GFR: 86.12 mL/min (ref >60.00)
GLUCOSE: 90 mg/dL (ref 70–99)
Potassium: 3.9 mEq/L (ref 3.5–5.1)
SODIUM: 139 meq/L (ref 135–145)
Total Protein: 7.4 g/dL (ref 6.0–8.3)

## 2016-09-13 LAB — CBC WITH DIFFERENTIAL/PLATELET
BASOS ABS: 0 10*3/uL (ref 0.0–0.1)
BASOS PCT: 0.7 % (ref 0.0–3.0)
EOS ABS: 0.2 10*3/uL (ref 0.0–0.7)
Eosinophils Relative: 2.7 % (ref 0.0–5.0)
HEMATOCRIT: 40.2 % (ref 36.0–46.0)
Hemoglobin: 13.9 g/dL (ref 12.0–15.0)
LYMPHS ABS: 1.6 10*3/uL (ref 0.7–4.0)
Lymphocytes Relative: 27.5 % (ref 12.0–46.0)
MCHC: 34.7 g/dL (ref 30.0–36.0)
MCV: 90.5 fl (ref 78.0–100.0)
MONO ABS: 0.6 10*3/uL (ref 0.1–1.0)
Monocytes Relative: 9.8 % (ref 3.0–12.0)
NEUTROS ABS: 3.6 10*3/uL (ref 1.4–7.7)
NEUTROS PCT: 59.3 % (ref 43.0–77.0)
PLATELETS: 187 10*3/uL (ref 150.0–400.0)
RBC: 4.44 Mil/uL (ref 3.87–5.11)
RDW: 12.7 % (ref 11.5–15.5)
WBC: 6 10*3/uL (ref 4.0–10.5)

## 2016-09-13 LAB — LIPID PANEL
CHOL/HDL RATIO: 3
Cholesterol: 145 mg/dL (ref 0–200)
HDL: 57.8 mg/dL (ref >39.00)
LDL CALC: 69 mg/dL (ref 0–99)
NonHDL: 87.22
TRIGLYCERIDES: 90 mg/dL (ref 0.0–149.0)
VLDL: 18 mg/dL (ref 0.0–40.0)

## 2016-09-13 LAB — TSH: TSH: 2.48 u[IU]/mL (ref 0.35–4.50)

## 2016-09-13 NOTE — Progress Notes (Signed)
Pre visit review using our clinic review tool, if applicable. No additional management support is needed unless otherwise documented below in the visit note. 

## 2016-09-13 NOTE — Progress Notes (Signed)
Subjective:  Patient ID: Leslie Martin, female    DOB: Jun 18, 1971  Age: 46 y.o. MRN: [id number]  CC: Annual Exam   HPI Leslie Martin presents for a CPX - Leslie Martin suffers from chronic, intermittent, nonradiating low back pain but her symptoms are adequately treated with the occasional visit with a chiropractor. Leslie Martin otherwise feels well and offers no other complaints.  History Leslie Martin has a past medical history of Back pain at L4-L5 level (11/29/2015); Breast mass, right (11/18/2014); Dyspareunia in female (11/29/2015); Fatigue (11/18/2014); and IBS (irritable bowel syndrome).   Leslie Martin has a past surgical history that includes Laparoscopic cholecystectomy (2006); Laparoscopic supracervical hysterectomy (12/07/2010); and Laparoscopic salpingoopherectomy (12/07/2010).   Her family history includes Cancer in her brother and father; Cleft lip in her son; Diabetes in her maternal grandmother, mother, and paternal grandmother; Hypertension in her brother and mother; Kidney failure in her mother; Other in her maternal grandmother and paternal grandmother.Leslie Martin reports that Leslie Martin has quit smoking. Her smoking use included Cigarettes. Leslie Martin quit after 2.00 years of use. Leslie Martin has never used smokeless tobacco. Leslie Martin reports that Leslie Martin drinks alcohol. Leslie Martin reports that Leslie Martin does not use drugs.  Outpatient Medications Prior to Visit  Medication Sig Dispense Refill  . cetirizine (ZYRTEC) 10 MG tablet Take 10 mg by mouth as needed for allergies.    . EST ESTROGENS-METHYLTEST DS 1.25-2.5 MG TABS TAKE ONE TABLET BY MOUTH ONCE DAILY 30 each 2  . estradiol (ESTRACE) 1 MG tablet TAKE ONE TABLET BY MOUTH ONCE DAILY 30 tablet 11  . doxycycline (VIBRAMYCIN) 50 MG capsule Take 1 capsule (50 mg total) by mouth daily. 30 capsule 1   No facility-administered medications prior to visit.     ROS Review of Systems  Musculoskeletal: Positive for back pain.  All other systems reviewed and are negative.   Objective:  BP 138/78 (BP Location: Left  Arm, Patient Position: Sitting, Cuff Size: Normal)   Pulse 69   Temp 97.9 F (36.6 C) (Oral)   Resp 16   Ht 5\' 2"  (1.575 m)   Wt 152 lb 12 oz (69.3 kg)   LMP 12/07/2010   SpO2 97%   BMI 27.94 kg/m   Physical Exam  Constitutional: Leslie Martin is oriented to person, place, and time. No distress.  HENT:  Mouth/Throat: Oropharynx is clear and moist. No oropharyngeal exudate.  Eyes: Conjunctivae are normal. Right eye exhibits no discharge. Left eye exhibits no discharge. No scleral icterus.  Neck: Normal range of motion. Neck supple. No JVD present. No tracheal deviation present. No thyromegaly present.  Cardiovascular: Normal rate, regular rhythm, normal heart sounds and intact distal pulses.  Exam reveals no gallop and no friction rub.   No murmur heard. Pulmonary/Chest: Effort normal and breath sounds normal. No stridor. No respiratory distress. Leslie Martin has no wheezes. Leslie Martin has no rales. Leslie Martin exhibits no tenderness.  Abdominal: Soft. Bowel sounds are normal. Leslie Martin exhibits no distension and no mass. There is no tenderness. There is no rebound and no guarding.  Musculoskeletal: Normal range of motion. Leslie Martin exhibits no edema, tenderness or deformity.  Lymphadenopathy:    Leslie Martin has no cervical adenopathy.  Neurological: Leslie Martin is oriented to person, place, and time.  Skin: Skin is warm and dry. No rash noted. Leslie Martin is not diaphoretic. No erythema. No pallor.  Vitals reviewed.   Lab Results  Component Value Date   WBC 6.0 09/13/2016   HGB 13.9 09/13/2016   HCT 40.2 09/13/2016   PLT 187.0 09/13/2016   GLUCOSE  90 09/13/2016   CHOL 145 09/13/2016   TRIG 90.0 09/13/2016   HDL 57.80 09/13/2016   LDLCALC 69 09/13/2016   ALT 14 09/13/2016   AST 17 09/13/2016   NA 139 09/13/2016   K 3.9 09/13/2016   CL 106 09/13/2016   CREATININE 0.77 09/13/2016   BUN 13 09/13/2016   CO2 28 09/13/2016   TSH 2.48 09/13/2016    Assessment & Plan:   [name] was seen today for annual exam.  Diagnoses and all orders for  this visit:  Routine general medical examination at a health care facility- Exam completed, labs reviewed, Leslie Martin refused a flu vaccine, Leslie Martin was given a Tdap booster, her Pap smear and mammogram are up-to-date, patient education material was given. -     Lipid panel; Future -     Comprehensive metabolic panel; Future -     CBC with Differential/Platelet; Future -     TSH; Future  Need for prophylactic vaccination against diphtheria-tetanus-pertussis (DTP) -     Tdap vaccine greater than or equal to 7yo IM   I have discontinued Leslie Martin's doxycycline. I am also having her maintain her cetirizine, EST ESTROGENS-METHYLTEST DS, and estradiol.  No orders of the defined types were placed in this encounter.    Follow-up: Return if symptoms worsen or fail to improve.  Sanda Lingerhomas Jasmine Mcbeth, MD

## 2016-09-13 NOTE — Patient Instructions (Signed)
Preventive Care 18-39 Years, Female Preventive care refers to lifestyle choices and visits with your health care provider that can promote health and wellness. What does preventive care include?  A yearly physical exam. This is also called an annual well check.  Dental exams once or twice a year.  Routine eye exams. Ask your health care provider how often you should have your eyes checked.  Personal lifestyle choices, including:  Daily care of your teeth and gums.  Regular physical activity.  Eating a healthy diet.  Avoiding tobacco and drug use.  Limiting alcohol use.  Practicing safe sex.  Taking vitamin and mineral supplements as recommended by your health care provider. What happens during an annual well check? The services and screenings done by your health care provider during your annual well check will depend on your age, overall health, lifestyle risk factors, and family history of disease. Counseling  Your health care provider may ask you questions about your:  Alcohol use.  Tobacco use.  Drug use.  Emotional well-being.  Home and relationship well-being.  Sexual activity.  Eating habits.  Work and work environment.  Method of birth control.  Menstrual cycle.  Pregnancy history. Screening  You may have the following tests or measurements:  Height, weight, and BMI.  Diabetes screening. This is done by checking your blood sugar (glucose) after you have not eaten for a while (fasting).  Blood pressure.  Lipid and cholesterol levels. These may be checked every 5 years starting at age 20.  Skin check.  Hepatitis C blood test.  Hepatitis B blood test.  Sexually transmitted disease (STD) testing.  BRCA-related cancer screening. This may be done if you have a family history of breast, ovarian, tubal, or peritoneal cancers.  Pelvic exam and Pap test. This may be done every 3 years starting at age 21. Starting at age 30, this may be done every 5  years if you have a Pap test in combination with an HPV test. Discuss your test results, treatment options, and if necessary, the need for more tests with your health care provider. Vaccines  Your health care provider may recommend certain vaccines, such as:  Influenza vaccine. This is recommended every year.  Tetanus, diphtheria, and acellular pertussis (Tdap, Td) vaccine. You may need a Td booster every 10 years.  Varicella vaccine. You may need this if you have not been vaccinated.  HPV vaccine. If you are 26 or younger, you may need three doses over 6 months.  Measles, mumps, and rubella (MMR) vaccine. You may need at least one dose of MMR. You may also need a second dose.  Pneumococcal 13-valent conjugate (PCV13) vaccine. You may need this if you have certain conditions and were not previously vaccinated.  Pneumococcal polysaccharide (PPSV23) vaccine. You may need one or two doses if you smoke cigarettes or if you have certain conditions.  Meningococcal vaccine. One dose is recommended if you are age 19-21 years and a first-year college student living in a residence hall, or if you have one of several medical conditions. You may also need additional booster doses.  Hepatitis A vaccine. You may need this if you have certain conditions or if you travel or work in places where you may be exposed to hepatitis A.  Hepatitis B vaccine. You may need this if you have certain conditions or if you travel or work in places where you may be exposed to hepatitis B.  Haemophilus influenzae type b (Hib) vaccine. You may need this   if you have certain risk factors. Talk to your health care provider about which screenings and vaccines you need and how often you need them. This information is not intended to replace advice given to you by your health care provider. Make sure you discuss any questions you have with your health care provider. Document Released: 09/05/2001 Document Revised: 03/29/2016  Document Reviewed: 05/11/2015 Elsevier Interactive Patient Education  2017 [employer].

## 2016-09-14 ENCOUNTER — Encounter: Payer: Self-pay | Admitting: Internal Medicine

## 2016-12-21 DIAGNOSIS — W57XXXA Bitten or stung by nonvenomous insect and other nonvenomous arthropods, initial encounter: Secondary | ICD-10-CM | POA: Diagnosis not present

## 2016-12-21 DIAGNOSIS — S30861A Insect bite (nonvenomous) of abdominal wall, initial encounter: Secondary | ICD-10-CM | POA: Diagnosis not present

## 2016-12-21 DIAGNOSIS — J4520 Mild intermittent asthma, uncomplicated: Secondary | ICD-10-CM | POA: Diagnosis not present

## 2017-05-17 ENCOUNTER — Encounter: Payer: Self-pay | Admitting: Adult Health

## 2017-05-17 ENCOUNTER — Ambulatory Visit (INDEPENDENT_AMBULATORY_CARE_PROVIDER_SITE_OTHER): Payer: BLUE CROSS/BLUE SHIELD | Admitting: Adult Health

## 2017-05-17 VITALS — BP 110/60 | HR 80 | Resp 18 | Ht 62.0 in | Wt 161.6 lb

## 2017-05-17 DIAGNOSIS — Z1212 Encounter for screening for malignant neoplasm of rectum: Secondary | ICD-10-CM

## 2017-05-17 DIAGNOSIS — Z1211 Encounter for screening for malignant neoplasm of colon: Secondary | ICD-10-CM | POA: Diagnosis not present

## 2017-05-17 DIAGNOSIS — Z01411 Encounter for gynecological examination (general) (routine) with abnormal findings: Secondary | ICD-10-CM | POA: Diagnosis not present

## 2017-05-17 DIAGNOSIS — Z79899 Other long term (current) drug therapy: Secondary | ICD-10-CM

## 2017-05-17 DIAGNOSIS — Z01419 Encounter for gynecological examination (general) (routine) without abnormal findings: Secondary | ICD-10-CM

## 2017-05-17 DIAGNOSIS — N816 Rectocele: Secondary | ICD-10-CM | POA: Diagnosis not present

## 2017-05-17 DIAGNOSIS — Z79890 Hormone replacement therapy (postmenopausal): Secondary | ICD-10-CM

## 2017-05-17 LAB — HEMOCCULT GUIAC POC 1CARD (OFFICE): Fecal Occult Blood, POC: NEGATIVE

## 2017-05-17 MED ORDER — ESTRADIOL 1 MG PO TABS: 1.0000 mg | ORAL_TABLET | Freq: Every day | ORAL | 3 refills | Status: DC

## 2017-05-17 NOTE — Patient Instructions (Signed)
Physical in 1 year Get mammogram yearly Pap in 2020

## 2017-05-17 NOTE — Progress Notes (Signed)
Patient ID: Leslie Martin, female   DOB: 05-29-1971, 46 y.o.   MRN: [id number] History of Present Illness: Leslie Martin is a 46 year old white female in for well woman gyn exam,had normal pap with negative HPV 11/29/15.    Current Medications, Allergies, Past Medical History, Past Surgical History, Family History and Social History were reviewed in [facility] electronic medical record.     Review of Systems: Patient denies any headaches, hearing loss, fatigue, blurred vision, shortness of breath, chest pain, abdominal pain, problems with  urination, or intercourse. No joint pain or mood swings.Constipated at times.    Physical Exam:BP 110/60 (BP Location: Right Arm, Patient Position: Sitting, Cuff Size: Normal)   Pulse 80   Resp 18   Ht 5\' 2"  (1.575 m)   Wt 161 lb 9.6 oz (73.3 kg)   LMP 12/07/2010   BMI 29.56 kg/m Waist 32 inches General:  Well developed, well nourished, no acute distress Skin:  Warm and dry Neck:  Midline trachea, normal thyroid, good ROM, no lymphadenopathy Lungs; Clear to auscultation bilaterally Breast:  No dominant palpable mass, retraction, or nipple discharge Cardiovascular: Regular rate and rhythm Abdomen:  Soft, non tender, no hepatosplenomegaly Pelvic:  External genitalia is normal in appearance, no lesions.  The vagina is normal in appearance. Urethra has no lesions or masses. The cervix is smooth.  Uterus is absent.  No adnexal masses or tenderness noted.Bladder is non tender, no masses felt. Rectal: Good sphincter tone, no polyps, or hemorrhoids felt.  Hemoccult negative.+rectocele Extremities/musculoskeletal:  No swelling or varicosities noted, no clubbing or cyanosis Psych:  No mood changes, alert and cooperative,seems happy PHQ 2 score 0.  Impression: 1. Encounter for well woman exam with routine gynecological exam   2. Screening for colorectal cancer   3. Current use of estrogen therapy   4. Rectocele       Plan: Meds ordered this encounter   Medications  . estradiol (ESTRACE) 1 MG tablet    Sig: Take 1 tablet (1 mg total) by mouth daily.    Dispense:  90 tablet    Refill:  3    Please consider 90 day supplies to promote better adherence    Order Specific Question:   Supervising Provider    Answer:   Lazaro ArmsEURE, LUTHER H [[id number]]  Physical in 1 year Pap in 2020 Get mammogram now  Try oatmeal,applesauce and prunes

## 2017-06-28 ENCOUNTER — Other Ambulatory Visit: Payer: Self-pay | Admitting: Adult Health

## 2017-09-24 DIAGNOSIS — S338XXA Sprain of other parts of lumbar spine and pelvis, initial encounter: Secondary | ICD-10-CM | POA: Diagnosis not present

## 2017-09-24 DIAGNOSIS — M546 Pain in thoracic spine: Secondary | ICD-10-CM | POA: Diagnosis not present

## 2017-09-24 DIAGNOSIS — S134XXA Sprain of ligaments of cervical spine, initial encounter: Secondary | ICD-10-CM | POA: Diagnosis not present

## 2017-10-08 DIAGNOSIS — S338XXA Sprain of other parts of lumbar spine and pelvis, initial encounter: Secondary | ICD-10-CM | POA: Diagnosis not present

## 2017-10-08 DIAGNOSIS — M546 Pain in thoracic spine: Secondary | ICD-10-CM | POA: Diagnosis not present

## 2017-10-08 DIAGNOSIS — S134XXA Sprain of ligaments of cervical spine, initial encounter: Secondary | ICD-10-CM | POA: Diagnosis not present

## 2017-11-27 DIAGNOSIS — Z6830 Body mass index (BMI) 30.0-30.9, adult: Secondary | ICD-10-CM | POA: Diagnosis not present

## 2017-11-27 DIAGNOSIS — J0100 Acute maxillary sinusitis, unspecified: Secondary | ICD-10-CM | POA: Diagnosis not present

## 2018-01-17 ENCOUNTER — Other Ambulatory Visit: Payer: Self-pay | Admitting: Adult Health

## 2018-03-01 DIAGNOSIS — Z6829 Body mass index (BMI) 29.0-29.9, adult: Secondary | ICD-10-CM | POA: Diagnosis not present

## 2018-03-01 DIAGNOSIS — B029 Zoster without complications: Secondary | ICD-10-CM | POA: Diagnosis not present

## 2018-03-06 ENCOUNTER — Encounter: Payer: Self-pay | Admitting: Nurse Practitioner

## 2018-03-06 ENCOUNTER — Ambulatory Visit: Payer: BLUE CROSS/BLUE SHIELD | Admitting: Nurse Practitioner

## 2018-03-06 VITALS — BP 124/78 | HR 53 | Temp 98.0°F | Resp 16 | Ht 62.0 in | Wt 165.1 lb

## 2018-03-06 DIAGNOSIS — B029 Zoster without complications: Secondary | ICD-10-CM

## 2018-03-06 MED ORDER — PREGABALIN 75 MG PO CAPS: 75.0000 mg | ORAL_CAPSULE | Freq: Two times a day (BID) | ORAL | 0 refills | Status: DC

## 2018-03-06 NOTE — Progress Notes (Signed)
Name: Leslie Martin   MRN: [id number]    DOB: 02/13/1971   Date:03/06/2018       Progress Note  Subjective  Chief Complaint  Chief Complaint  Patient presents with  . Herpes Zoster    went to urgent care and was dx with shingles was given prednisone and valtrex, having severe pain from shingles, took last prednisone today    HPI  Leslie Martin is here today for evaluation of an acute complaint of shingles She was diagnosed with shingles at an urgent care on 03/01/18 and started on prednisone and valtrex course-she completed prednisone course today and has about 3 days of valtrex left but still having significant pain and noticed the rash spread from left posterior neck, left shoulder and left upper arm to left anterior chest after her urgent care evaluation, but has not spread further. She has also noticed pain in her left ear over past several days as well. She describes the pain as a burning pain. She has applied cortisone to the rash which does not provide relief. She denies fevers, chills, malaise, fatigue, chest pain, shortness of breath, nausea, vomiting.  Patient Active Problem List   Diagnosis Date Noted  . Rectocele 05/17/2017  . Encounter for well woman exam with routine gynecological exam 09/13/2016  . Back pain at L4-L5 level 11/29/2015  . Current use of estrogen therapy 11/18/2014    Social History   Tobacco Use  . Smoking status: Former Smoker    Years: 2.00    Types: Cigarettes  . Smokeless tobacco: Never Used  Substance Use Topics  . Alcohol use: Yes    Alcohol/week: 2.5 standard drinks    Types: 1 Standard drinks or equivalent, 2 Glasses of wine per week    Comment: once in a blue moon     Current Outpatient Medications:  .  cetirizine (ZYRTEC) 10 MG tablet, Take 10 mg by mouth as needed for allergies., Disp: , Rfl:  .  EST ESTROGENS-METHYLTEST DS 1.25-2.5 MG TABS, TAKE 1 TABLET BY MOUTH ONCE DAILY, Disp: 30 each, Rfl: 5 .  estradiol (ESTRACE) 1 MG tablet, Take 1  tablet (1 mg total) by mouth daily., Disp: 90 tablet, Rfl: 3  Allergies  Allergen Reactions  . Ibuprofen Swelling    Eyes and lips swell    ROS  No other specific complaints in a complete review of systems (except as listed in HPI above).  Objective  Vitals:   03/06/18 1416  BP: 124/78  Pulse: (!) 53  Resp: 16  Temp: 98 F (36.7 C)  TempSrc: Oral  SpO2: 98%  Weight: 165 lb 1.9 oz (74.9 kg)  Height: 5\' 2"  (1.575 m)   Body mass index is 30.2 kg/m.  Nursing Note and Vital Signs reviewed.  Physical Exam  Constitutional: Patient appears well-developed and well-nourished. No distress.  HEENT: head atraumatic, normocephalic, pupils equal and reactive to light, EOM's intact, TM's without erythema or bulging, neck supple, oropharynx pink and moist without exudate Cardiovascular: Normal rate, regular rhythm, S1/S2 present.  Distal pulses intact. Pulmonary/Chest: Effort normal and breath sounds clear. No respiratory distress or retractions. Neurological: She is alert and oriented to person, place, and time. No cranial nerve deficit. Coordination, balance, strength, speech and gait are normal.  Skin: Erythematous crusted rash to left posterior neck, left shoulder, left upper arm. Psychiatric: Patient has a normal mood and affect. behavior is normal. Judgment and thought content normal.   Assessment & Plan  1. Herpes zoster without complication  Will treat with 2-3 week course of lyrica for continued pain, dosing and side effects discussed, 1 week sample provided with coupon card Instructed to complete valtrex course -Red flags and when to present for emergency care or RTC including fever >101.31F, chest pain, shortness of breath, new/worsening/un-resolving symptoms, reviewed with patient at time of visit. Follow up and care instructions discussed and provided in AVS. - pregabalin (LYRICA) 75 MG capsule; Take 1 capsule (75 mg total) by mouth 2 (two) times daily.  Dispense: 30  capsule; Refill: 0

## 2018-03-06 NOTE — Patient Instructions (Signed)
Please continue valtrex until complete Please stART lyrica 75 mg twice daily for the next 2-3 weeks If your pain continues beyond this please follow up for further evaluation.   Shingles Shingles is an infection that causes a painful skin rash and fluid-filled blisters. Shingles is caused by the same virus that causes chickenpox. Shingles only develops in people who:  Have had chickenpox.  Have gotten the chickenpox vaccine. (This is rare.)  The first symptoms of shingles may be itching, tingling, or pain in an area on your skin. A rash will follow in a few days or weeks. The rash is usually on one side of the body in a bandlike or beltlike pattern. Over time, the rash turns into fluid-filled blisters that break open, scab over, and dry up. Medicines may:  Help you manage pain.  Help you recover more quickly.  Help to prevent long-term problems.  Follow these instructions at home: Medicines  Take medicines only as told by your doctor.  Apply an anti-itch or numbing cream to the affected area as told by your doctor. Blister and Rash Care  Take a cool bath or put cool compresses on the area of the rash or blisters as told by your doctor. This may help with pain and itching.  Keep your rash covered with a loose bandage (dressing). Wear loose-fitting clothing.  Keep your rash and blisters clean with mild soap and cool water or as told by your doctor.  Check your rash every day for signs of infection. These include redness, swelling, and pain that lasts or gets worse.  Do not pick your blisters.  Do not scratch your rash. General instructions  Rest as told by your doctor.  Keep all follow-up visits as told by your doctor. This is important.  Until your blisters scab over, your infection can cause chickenpox in people who have never had it or been vaccinated against it. To prevent this from happening, avoid touching other people or being around other people,  especially: ? Babies. ? Pregnant women. ? Children who have eczema. ? Elderly people who have transplants. ? People who have chronic illnesses, such as leukemia or AIDS. Contact a doctor if:  Your pain does not get better with medicine.  Your pain does not get better after the rash heals.  Your rash looks infected. Signs of infection include: ? Redness. ? Swelling. ? Pain that lasts or gets worse. Get help right away if:  The rash is on your face or nose.  You have pain in your face, pain around your eye area, or loss of feeling on one side of your face.  You have ear pain or you have ringing in your ear.  You have loss of taste.  Your condition gets worse. This information is not intended to replace advice given to you by your health care provider. Make sure you discuss any questions you have with your health care provider. Document Released: 12/27/2007 Document Revised: 03/05/2016 Document Reviewed: 04/21/2014 Elsevier Interactive Patient Education  [employer]2018 Elsevier Inc.

## 2018-03-14 DIAGNOSIS — B029 Zoster without complications: Secondary | ICD-10-CM | POA: Diagnosis not present

## 2018-04-15 DIAGNOSIS — N951 Menopausal and female climacteric states: Secondary | ICD-10-CM | POA: Diagnosis not present

## 2018-04-22 DIAGNOSIS — R4586 Emotional lability: Secondary | ICD-10-CM | POA: Diagnosis not present

## 2018-04-22 DIAGNOSIS — R232 Flushing: Secondary | ICD-10-CM | POA: Diagnosis not present

## 2018-04-22 DIAGNOSIS — N951 Menopausal and female climacteric states: Secondary | ICD-10-CM | POA: Diagnosis not present

## 2018-04-22 DIAGNOSIS — Z72820 Sleep deprivation: Secondary | ICD-10-CM | POA: Diagnosis not present

## 2018-04-26 DIAGNOSIS — R635 Abnormal weight gain: Secondary | ICD-10-CM | POA: Diagnosis not present

## 2018-04-29 DIAGNOSIS — E663 Overweight: Secondary | ICD-10-CM | POA: Diagnosis not present

## 2018-04-29 DIAGNOSIS — Z1331 Encounter for screening for depression: Secondary | ICD-10-CM | POA: Diagnosis not present

## 2018-04-29 DIAGNOSIS — R7989 Other specified abnormal findings of blood chemistry: Secondary | ICD-10-CM | POA: Diagnosis not present

## 2018-04-29 DIAGNOSIS — Z1339 Encounter for screening examination for other mental health and behavioral disorders: Secondary | ICD-10-CM | POA: Diagnosis not present

## 2018-04-29 DIAGNOSIS — E7800 Pure hypercholesterolemia, unspecified: Secondary | ICD-10-CM | POA: Diagnosis not present

## 2018-05-06 DIAGNOSIS — E7800 Pure hypercholesterolemia, unspecified: Secondary | ICD-10-CM | POA: Diagnosis not present

## 2018-05-06 DIAGNOSIS — E663 Overweight: Secondary | ICD-10-CM | POA: Diagnosis not present

## 2018-05-13 DIAGNOSIS — E663 Overweight: Secondary | ICD-10-CM | POA: Diagnosis not present

## 2018-05-13 DIAGNOSIS — M2550 Pain in unspecified joint: Secondary | ICD-10-CM | POA: Diagnosis not present

## 2018-05-20 DIAGNOSIS — E663 Overweight: Secondary | ICD-10-CM | POA: Diagnosis not present

## 2018-05-20 DIAGNOSIS — E7800 Pure hypercholesterolemia, unspecified: Secondary | ICD-10-CM | POA: Diagnosis not present

## 2018-05-20 DIAGNOSIS — R232 Flushing: Secondary | ICD-10-CM | POA: Diagnosis not present

## 2018-05-20 DIAGNOSIS — R4586 Emotional lability: Secondary | ICD-10-CM | POA: Diagnosis not present

## 2018-05-20 DIAGNOSIS — N951 Menopausal and female climacteric states: Secondary | ICD-10-CM | POA: Diagnosis not present

## 2018-05-20 DIAGNOSIS — Z72820 Sleep deprivation: Secondary | ICD-10-CM | POA: Diagnosis not present

## 2018-05-22 ENCOUNTER — Ambulatory Visit: Payer: BLUE CROSS/BLUE SHIELD | Admitting: Adult Health

## 2018-05-22 ENCOUNTER — Other Ambulatory Visit: Payer: Self-pay | Admitting: Adult Health

## 2018-05-22 ENCOUNTER — Encounter: Payer: Self-pay | Admitting: Adult Health

## 2018-05-22 VITALS — BP 129/80 | HR 59 | Ht 62.0 in | Wt 162.4 lb

## 2018-05-22 DIAGNOSIS — Z1211 Encounter for screening for malignant neoplasm of colon: Secondary | ICD-10-CM | POA: Insufficient documentation

## 2018-05-22 DIAGNOSIS — Z1231 Encounter for screening mammogram for malignant neoplasm of breast: Secondary | ICD-10-CM

## 2018-05-22 DIAGNOSIS — Z01419 Encounter for gynecological examination (general) (routine) without abnormal findings: Secondary | ICD-10-CM

## 2018-05-22 DIAGNOSIS — Z1212 Encounter for screening for malignant neoplasm of rectum: Secondary | ICD-10-CM

## 2018-05-22 LAB — HEMOCCULT GUIAC POC 1CARD (OFFICE): Fecal Occult Blood, POC: NEGATIVE

## 2018-05-22 NOTE — Progress Notes (Signed)
Patient ID: Leslie Martin, female   DOB: 02/05/1971, 47 y.o.   MRN: [id number] History of Present Illness: Leslie Martin is a 47 year old white female, married in for well woman gyn exam, she is sp [facility].She had estradiol/testosterone pellets injected and is using B12 , Prometrium and amino acids and feels better.She had normal pap with negative HPV 11/29/15. PCP is Dr Sanda Linger.   Current Medications, Allergies, Past Medical History, Past Surgical History, Family History and Social History were reviewed in [employer] [facility] record.     Review of Systems: Patient denies any headaches, hearing loss, fatigue, blurred vision, shortness of breath, chest pain, abdominal pain, problems with bowel movements, urination, or intercourse. No joint pain or mood swings.    Physical Exam:BP 129/80 (BP Location: Left Arm, Patient Position: Sitting, Cuff Size: Normal)   Pulse (!) 59   Ht 5\' 2"  (1.575 m)   Wt 162 lb 6.4 oz (73.7 kg)   LMP 12/07/2010 Comment: [facility]  BMI 29.70 kg/m  waist measures 32 inches.TC 198 in September. General:  Well developed, well nourished, no acute distress Skin:  Warm and dry Neck:  Midline trachea, normal thyroid, good ROM, no lymphadenopathy Lungs; Clear to auscultation bilaterally Breast:  No dominant palpable mass, retraction, or nipple discharge Cardiovascular: Regular rate and rhythm Abdomen:  Soft, non tender, no hepatosplenomegaly Pelvic:  External genitalia is normal in appearance, no lesions.  The vagina is normal in appearance. Urethra has no lesions or masses. The cervix is bulbous.  Uterus is absent.  No adnexal masses or tenderness noted.Bladder is non tender, no masses felt. Rectal: Good sphincter tone, no polyps, or hemorrhoids felt.  Hemoccult negative. Extremities/musculoskeletal:  No swelling or varicosities noted, no clubbing or cyanosis Psych:  No mood changes, alert and cooperative,seems happy PHQ 9 score 0. Examination chaperoned by Federico Flake  CMA. Discussed with Dr Alysia Penna and pt aware that pellets not FDA approved, and cary same risks as HRT.   Impression: 1. Encounter for well woman exam with routine gynecological exam   2. Screening for colorectal cancer       Plan: Mammogram 11/14 at 7:30 am at [facility] Pap and physical in 1 year

## 2018-05-27 DIAGNOSIS — R7989 Other specified abnormal findings of blood chemistry: Secondary | ICD-10-CM | POA: Diagnosis not present

## 2018-05-27 DIAGNOSIS — E039 Hypothyroidism, unspecified: Secondary | ICD-10-CM | POA: Diagnosis not present

## 2018-05-27 DIAGNOSIS — R232 Flushing: Secondary | ICD-10-CM | POA: Diagnosis not present

## 2018-05-27 DIAGNOSIS — E663 Overweight: Secondary | ICD-10-CM | POA: Diagnosis not present

## 2018-06-03 DIAGNOSIS — E663 Overweight: Secondary | ICD-10-CM | POA: Diagnosis not present

## 2018-06-03 DIAGNOSIS — E7800 Pure hypercholesterolemia, unspecified: Secondary | ICD-10-CM | POA: Diagnosis not present

## 2018-06-06 ENCOUNTER — Ambulatory Visit (HOSPITAL_COMMUNITY)
Admission: RE | Admit: 2018-06-06 | Discharge: 2018-06-06 | Disposition: A | Discharge status: Home / Self Care | Payer: BLUE CROSS/BLUE SHIELD | Source: Ambulatory Visit | Attending: Adult Health | Admitting: Adult Health

## 2018-06-06 DIAGNOSIS — Z1231 Encounter for screening mammogram for malignant neoplasm of breast: Secondary | ICD-10-CM

## 2018-06-10 DIAGNOSIS — E7800 Pure hypercholesterolemia, unspecified: Secondary | ICD-10-CM | POA: Diagnosis not present

## 2018-06-10 DIAGNOSIS — E663 Overweight: Secondary | ICD-10-CM | POA: Diagnosis not present

## 2018-06-17 DIAGNOSIS — M2550 Pain in unspecified joint: Secondary | ICD-10-CM | POA: Diagnosis not present

## 2018-06-17 DIAGNOSIS — E663 Overweight: Secondary | ICD-10-CM | POA: Diagnosis not present

## 2018-06-24 DIAGNOSIS — E663 Overweight: Secondary | ICD-10-CM | POA: Diagnosis not present

## 2018-06-24 DIAGNOSIS — E7800 Pure hypercholesterolemia, unspecified: Secondary | ICD-10-CM | POA: Diagnosis not present

## 2018-06-24 DIAGNOSIS — M2550 Pain in unspecified joint: Secondary | ICD-10-CM | POA: Diagnosis not present

## 2018-07-01 DIAGNOSIS — Z6829 Body mass index (BMI) 29.0-29.9, adult: Secondary | ICD-10-CM | POA: Diagnosis not present

## 2018-07-01 DIAGNOSIS — E7800 Pure hypercholesterolemia, unspecified: Secondary | ICD-10-CM | POA: Diagnosis not present

## 2018-09-03 ENCOUNTER — Ambulatory Visit: Payer: BLUE CROSS/BLUE SHIELD | Admitting: Internal Medicine

## 2018-09-03 ENCOUNTER — Encounter: Payer: Self-pay | Admitting: Internal Medicine

## 2018-09-03 DIAGNOSIS — J069 Acute upper respiratory infection, unspecified: Secondary | ICD-10-CM | POA: Diagnosis not present

## 2018-09-03 NOTE — Patient Instructions (Signed)
Start taking the flonase 2 sprays in each nostril once a day for the next 1-2 weeks.   The average virus lasts 7-10 days and you feel the worst by about day 4-5.

## 2018-09-03 NOTE — Progress Notes (Signed)
   Subjective:   Patient ID: Leslie Martin, female    DOB: 1971-06-06, 48 y.o.   MRN: [id number]  HPI The patient is a 48 y.o. female coming in for cold symptoms. Started last night with right ear pain. Main symptoms are: right ear pain and popping. Denies fevers or chills or cough. Some mild sore throat. Overall it is worsening. Has tried nothing for it.   Review of Systems  Constitutional: Negative for activity change, appetite change, chills, fatigue, fever and unexpected weight change.  HENT: Positive for congestion, ear pain and rhinorrhea. Negative for ear discharge, postnasal drip, sinus pressure, sinus pain, sneezing, sore throat, tinnitus, trouble swallowing and voice change.   Eyes: Negative.   Respiratory: Negative for cough, chest tightness, shortness of breath and wheezing.   Cardiovascular: Negative.   Gastrointestinal: Negative.   Neurological: Negative.     Objective:  Physical Exam Constitutional:      Appearance: She is well-developed.  HENT:     Head: Normocephalic and atraumatic.     Comments: Oropharynx with redness and clear drainage, nose with swollen turbinates, TMs normal bilaterally.  Neck:     Musculoskeletal: Normal range of motion.     Thyroid: No thyromegaly.  Cardiovascular:     Rate and Rhythm: Normal rate and regular rhythm.  Pulmonary:     Effort: Pulmonary effort is normal. No respiratory distress.     Breath sounds: Normal breath sounds. No wheezing or rales.  Abdominal:     Palpations: Abdomen is soft.  Musculoskeletal:        General: Tenderness present.  Lymphadenopathy:     Cervical: No cervical adenopathy.  Skin:    General: Skin is warm and dry.  Neurological:     Mental Status: She is alert and oriented to person, place, and time.     Vitals:   09/03/18 1357  BP: 122/68  Pulse: 77  Temp: 97.8 F (36.6 C)  TempSrc: Oral  SpO2: 97%  Weight: 166 lb (75.3 kg)  Height: 5\' 2"  (1.575 m)    Assessment & Plan:

## 2018-09-03 NOTE — Assessment & Plan Note (Signed)
Encouraged flonase and zyrtec. Advised no antibiotics or steroids indicated at this time. She will reach back out if symptoms do not improve as expected.

## 2018-09-05 ENCOUNTER — Encounter: Payer: Self-pay | Admitting: Internal Medicine

## 2018-12-26 IMAGING — MG DIGITAL SCREENING BILATERAL MAMMOGRAM WITH TOMO AND CAD
8 series · 9 of 24 positions shown · non-contrast
Comparison: Previous exam(s).

CLINICAL DATA: Screening.

EXAM:
DIGITAL SCREENING BILATERAL MAMMOGRAM WITH TOMO AND CAD

[R MLO synth-2D]
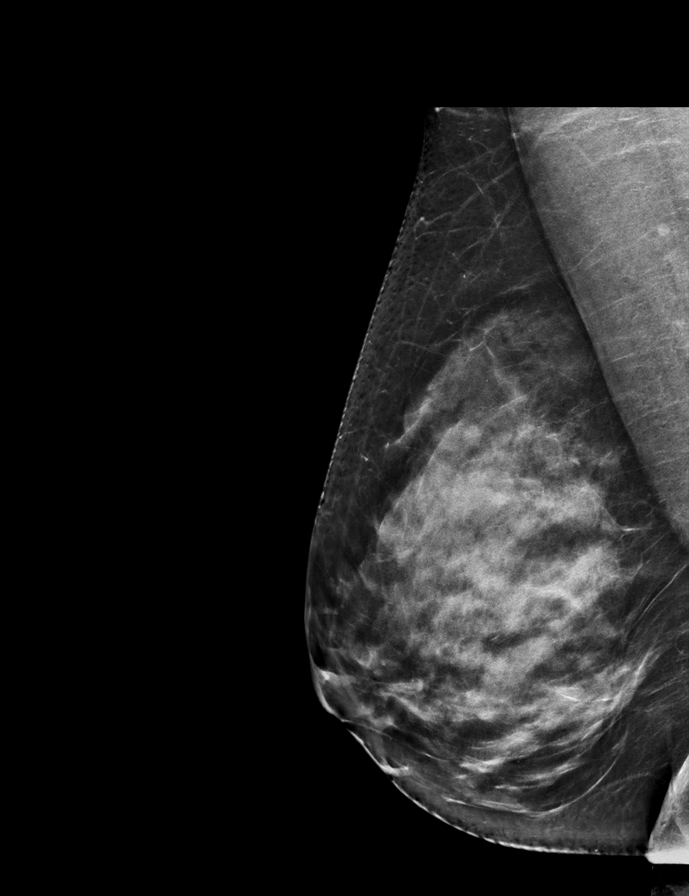

[L MLO synth-2D]
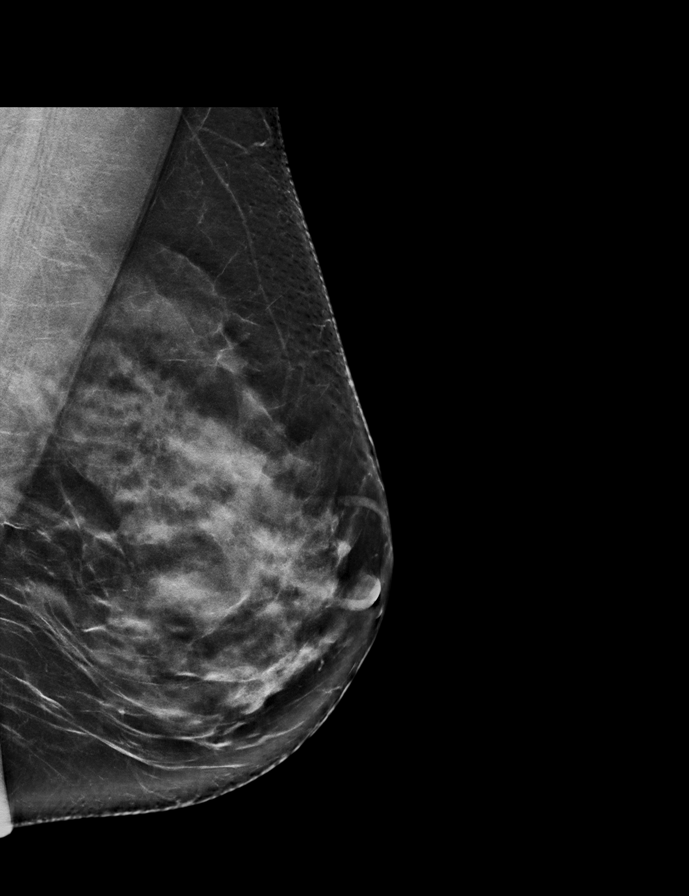

[R CC synth-2D]
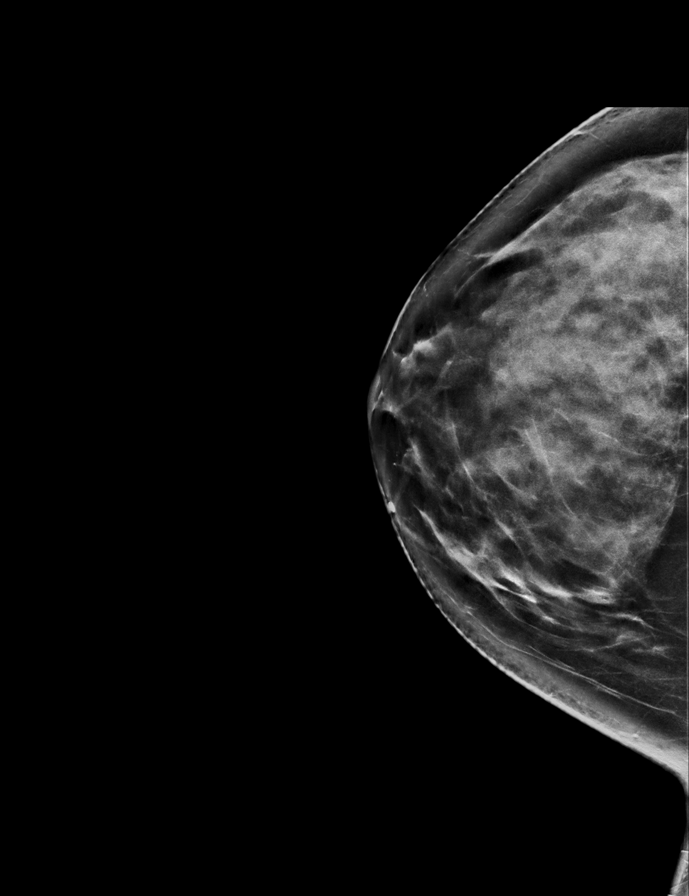

[L CC synth-2D]
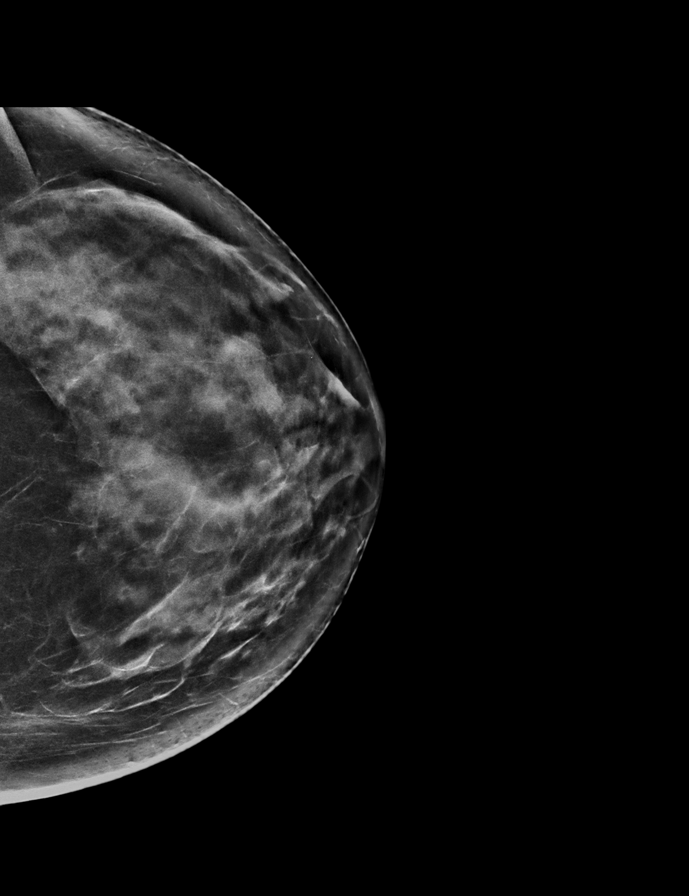

[L MLO tomo · 2 of 81 frames shown]
[frame 27/81]
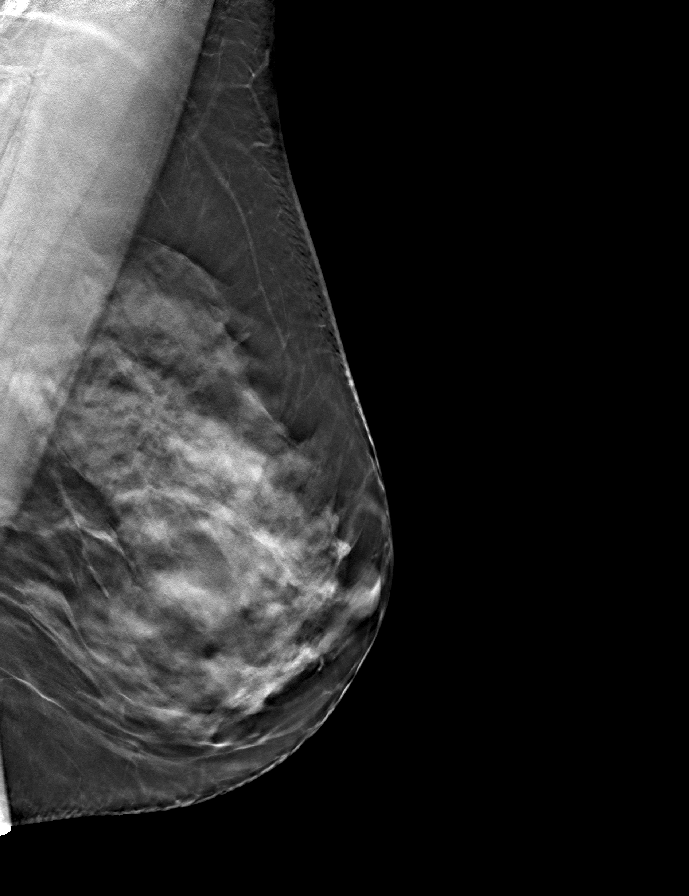
[frame 41/81]
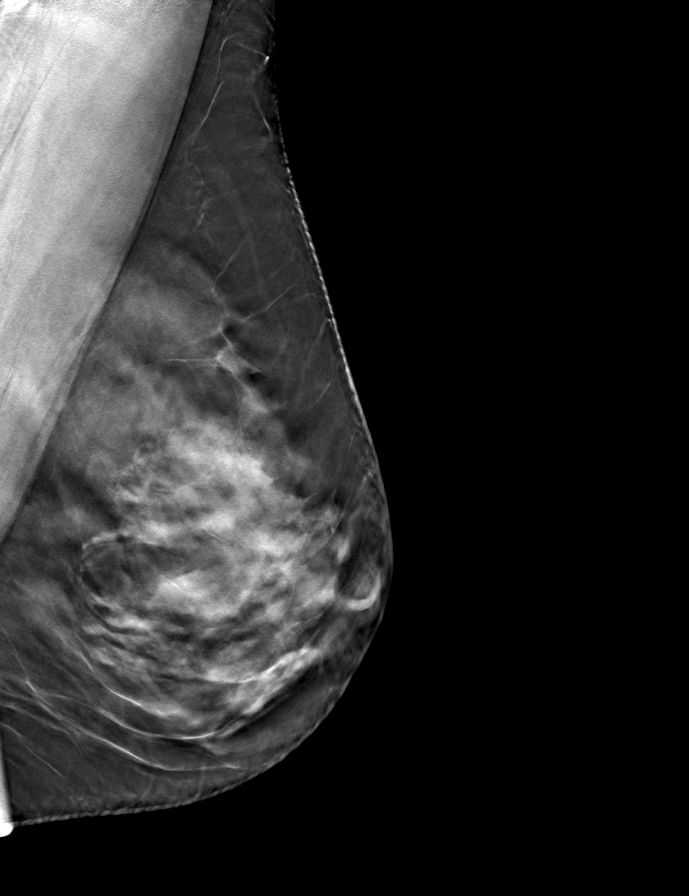

[R MLO tomo · tomo slice 39/77.0]
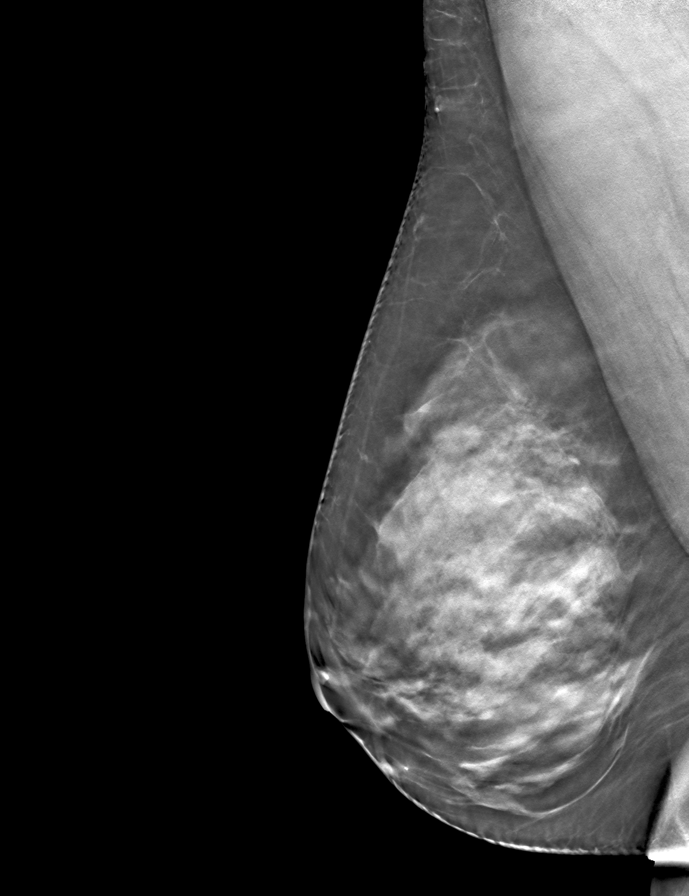

[L CC tomo · tomo slice 39/76.0]
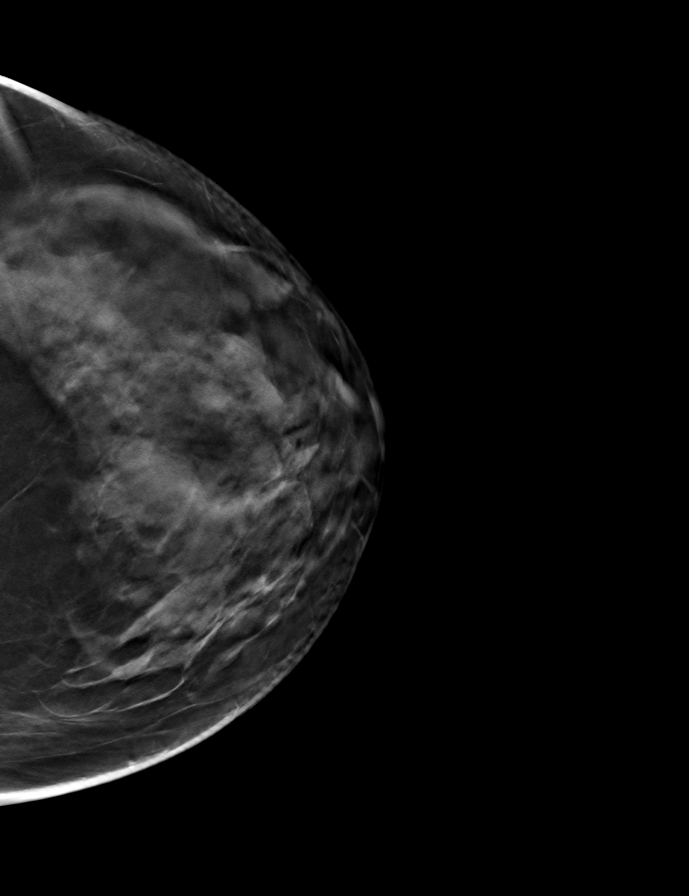

[R CC tomo · tomo slice 37/74.0]
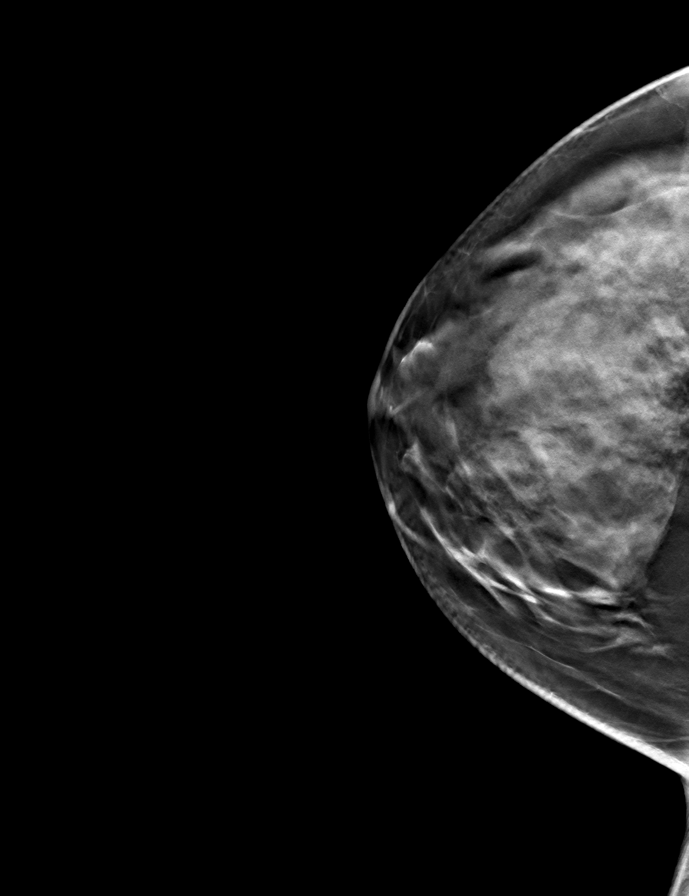

[9 of 24 positions shown; findings below may reference images not displayed]

ACR Breast Density Category d: The breast tissue is extremely dense,
which lowers the sensitivity of mammography.
FINDINGS: There are no findings suspicious for malignancy. Images were
processed with CAD.
IMPRESSION: No mammographic evidence of malignancy. A result letter of this
screening mammogram will be mailed directly to the patient.

RECOMMENDATION:
Screening mammogram in one year. (Code:RA-I-AVB)

BI-RADS CATEGORY  1: Negative.

## 2019-01-13 DIAGNOSIS — Z72820 Sleep deprivation: Secondary | ICD-10-CM | POA: Diagnosis not present

## 2019-01-13 DIAGNOSIS — R232 Flushing: Secondary | ICD-10-CM | POA: Diagnosis not present

## 2019-01-13 DIAGNOSIS — R6882 Decreased libido: Secondary | ICD-10-CM | POA: Diagnosis not present

## 2019-01-13 DIAGNOSIS — E039 Hypothyroidism, unspecified: Secondary | ICD-10-CM | POA: Diagnosis not present

## 2019-01-13 DIAGNOSIS — N951 Menopausal and female climacteric states: Secondary | ICD-10-CM | POA: Diagnosis not present

## 2019-01-15 DIAGNOSIS — R5383 Other fatigue: Secondary | ICD-10-CM | POA: Diagnosis not present

## 2019-01-15 DIAGNOSIS — N951 Menopausal and female climacteric states: Secondary | ICD-10-CM | POA: Diagnosis not present

## 2019-01-15 DIAGNOSIS — R232 Flushing: Secondary | ICD-10-CM | POA: Diagnosis not present

## 2019-02-18 DIAGNOSIS — M9905 Segmental and somatic dysfunction of pelvic region: Secondary | ICD-10-CM | POA: Diagnosis not present

## 2019-02-18 DIAGNOSIS — M9902 Segmental and somatic dysfunction of thoracic region: Secondary | ICD-10-CM | POA: Diagnosis not present

## 2019-02-18 DIAGNOSIS — M9903 Segmental and somatic dysfunction of lumbar region: Secondary | ICD-10-CM | POA: Diagnosis not present

## 2019-02-18 DIAGNOSIS — M9901 Segmental and somatic dysfunction of cervical region: Secondary | ICD-10-CM | POA: Diagnosis not present

## 2019-02-20 DIAGNOSIS — M9902 Segmental and somatic dysfunction of thoracic region: Secondary | ICD-10-CM | POA: Diagnosis not present

## 2019-02-20 DIAGNOSIS — M9903 Segmental and somatic dysfunction of lumbar region: Secondary | ICD-10-CM | POA: Diagnosis not present

## 2019-02-20 DIAGNOSIS — M9905 Segmental and somatic dysfunction of pelvic region: Secondary | ICD-10-CM | POA: Diagnosis not present

## 2019-02-20 DIAGNOSIS — M9901 Segmental and somatic dysfunction of cervical region: Secondary | ICD-10-CM | POA: Diagnosis not present

## 2019-02-26 DIAGNOSIS — M9902 Segmental and somatic dysfunction of thoracic region: Secondary | ICD-10-CM | POA: Diagnosis not present

## 2019-02-26 DIAGNOSIS — M9901 Segmental and somatic dysfunction of cervical region: Secondary | ICD-10-CM | POA: Diagnosis not present

## 2019-02-26 DIAGNOSIS — M9905 Segmental and somatic dysfunction of pelvic region: Secondary | ICD-10-CM | POA: Diagnosis not present

## 2019-02-26 DIAGNOSIS — M9903 Segmental and somatic dysfunction of lumbar region: Secondary | ICD-10-CM | POA: Diagnosis not present

## 2019-03-11 DIAGNOSIS — M9901 Segmental and somatic dysfunction of cervical region: Secondary | ICD-10-CM | POA: Diagnosis not present

## 2019-03-11 DIAGNOSIS — M9902 Segmental and somatic dysfunction of thoracic region: Secondary | ICD-10-CM | POA: Diagnosis not present

## 2019-03-11 DIAGNOSIS — M9905 Segmental and somatic dysfunction of pelvic region: Secondary | ICD-10-CM | POA: Diagnosis not present

## 2019-03-11 DIAGNOSIS — M9903 Segmental and somatic dysfunction of lumbar region: Secondary | ICD-10-CM | POA: Diagnosis not present

## 2019-03-20 DIAGNOSIS — M9901 Segmental and somatic dysfunction of cervical region: Secondary | ICD-10-CM | POA: Diagnosis not present

## 2019-03-20 DIAGNOSIS — M9903 Segmental and somatic dysfunction of lumbar region: Secondary | ICD-10-CM | POA: Diagnosis not present

## 2019-03-20 DIAGNOSIS — M9905 Segmental and somatic dysfunction of pelvic region: Secondary | ICD-10-CM | POA: Diagnosis not present

## 2019-03-20 DIAGNOSIS — M9902 Segmental and somatic dysfunction of thoracic region: Secondary | ICD-10-CM | POA: Diagnosis not present

## 2019-04-15 DIAGNOSIS — R5383 Other fatigue: Secondary | ICD-10-CM | POA: Diagnosis not present

## 2019-04-15 DIAGNOSIS — R232 Flushing: Secondary | ICD-10-CM | POA: Diagnosis not present

## 2019-04-15 DIAGNOSIS — N951 Menopausal and female climacteric states: Secondary | ICD-10-CM | POA: Diagnosis not present

## 2019-04-17 DIAGNOSIS — R5383 Other fatigue: Secondary | ICD-10-CM | POA: Diagnosis not present

## 2019-04-17 DIAGNOSIS — R6882 Decreased libido: Secondary | ICD-10-CM | POA: Diagnosis not present

## 2019-04-17 DIAGNOSIS — N951 Menopausal and female climacteric states: Secondary | ICD-10-CM | POA: Diagnosis not present

## 2019-04-17 DIAGNOSIS — Z72820 Sleep deprivation: Secondary | ICD-10-CM | POA: Diagnosis not present

## 2019-05-16 DIAGNOSIS — R7989 Other specified abnormal findings of blood chemistry: Secondary | ICD-10-CM | POA: Diagnosis not present

## 2019-05-16 DIAGNOSIS — R5383 Other fatigue: Secondary | ICD-10-CM | POA: Diagnosis not present

## 2019-05-16 DIAGNOSIS — E7800 Pure hypercholesterolemia, unspecified: Secondary | ICD-10-CM | POA: Diagnosis not present

## 2019-05-20 DIAGNOSIS — R7989 Other specified abnormal findings of blood chemistry: Secondary | ICD-10-CM | POA: Diagnosis not present

## 2019-05-20 DIAGNOSIS — E7800 Pure hypercholesterolemia, unspecified: Secondary | ICD-10-CM | POA: Diagnosis not present

## 2019-05-20 DIAGNOSIS — Z6831 Body mass index (BMI) 31.0-31.9, adult: Secondary | ICD-10-CM | POA: Diagnosis not present

## 2019-06-25 ENCOUNTER — Other Ambulatory Visit: Payer: Self-pay | Admitting: Adult Health

## 2019-06-25 MED ORDER — EST ESTROGENS-METHYLTEST 1.25-2.5 MG PO TABS: 1.0000 | ORAL_TABLET | Freq: Every day | ORAL | 5 refills | Status: DC

## 2019-06-25 NOTE — Progress Notes (Signed)
Will rx estratest

## 2019-07-10 DIAGNOSIS — N951 Menopausal and female climacteric states: Secondary | ICD-10-CM | POA: Diagnosis not present

## 2019-07-10 DIAGNOSIS — R232 Flushing: Secondary | ICD-10-CM | POA: Diagnosis not present

## 2019-07-10 DIAGNOSIS — R4586 Emotional lability: Secondary | ICD-10-CM | POA: Diagnosis not present

## 2019-07-10 DIAGNOSIS — R5383 Other fatigue: Secondary | ICD-10-CM | POA: Diagnosis not present

## 2019-07-10 DIAGNOSIS — Z72820 Sleep deprivation: Secondary | ICD-10-CM | POA: Diagnosis not present

## 2019-07-14 DIAGNOSIS — R232 Flushing: Secondary | ICD-10-CM | POA: Diagnosis not present

## 2019-07-14 DIAGNOSIS — N951 Menopausal and female climacteric states: Secondary | ICD-10-CM | POA: Diagnosis not present

## 2019-07-14 DIAGNOSIS — R5383 Other fatigue: Secondary | ICD-10-CM | POA: Diagnosis not present

## 2019-07-28 ENCOUNTER — Ambulatory Visit (INDEPENDENT_AMBULATORY_CARE_PROVIDER_SITE_OTHER)
Admission: RE | Admit: 2019-07-28 | Discharge: 2019-07-28 | Disposition: A | Discharge status: Home / Self Care | Payer: Self-pay | Source: Ambulatory Visit

## 2019-07-28 DIAGNOSIS — L299 Pruritus, unspecified: Secondary | ICD-10-CM

## 2019-07-28 MED ORDER — SARNA 0.5-0.5 % EX LOTN: 1.0000 "application " | TOPICAL_LOTION | CUTANEOUS | 0 refills | Status: DC | PRN

## 2019-07-28 MED ORDER — HYDROXYZINE HCL 25 MG PO TABS: 25.0000 mg | ORAL_TABLET | Freq: Four times a day (QID) | ORAL | 0 refills | Status: AC

## 2019-07-28 NOTE — Discharge Instructions (Signed)
Start hydroxyzine and SARNA as directed. Avoid itching. Monitor new changes. If noticing signs of infection such as spreading redness, warmth, fever, pain, follow up for in person evaluation. Otherwise, follow up with PCP if symptoms not improving.

## 2019-07-28 NOTE — ED Provider Notes (Signed)
Virtual Visit via Video Note:  Leslie Martin  initiated request for Telemedicine visit with [facility] Urgent [facility] team. I connected with Leslie Martin  on 07/28/2019 at 11:59 AM  for a synchronized telemedicine visit using a video enabled HIPPA compliant telemedicine application. I verified that I am speaking with Leslie Martin  using two identifiers. Vasilisa Vore Doree Fudge, PA-C  was physically located in a [facility] Urgent care site and Leslie Martin was located at a different location.   The limitations of evaluation and management by telemedicine as well as the availability of in-person appointments were discussed. Patient was informed that she  may incur a bill ( including co-pay) for this virtual visit encounter. Leslie Martin  expressed understanding and gave verbal consent to proceed with virtual visit.   History of Present Illness:Leslie Martin  is a 49 y.o. female presents with 1-2 days ago with rash to the body. First noticed to the upper extremities, but now throughout the body. Itching with redness. Denies URI symptoms such as cough, congestion, sore throat. Denies fever, chills, body aches. No new product changes. New probiotic/hygiene product.   Past Medical History:  Diagnosis Date  . Back pain at L4-L5 level 11/29/2015  . Breast mass, right 11/18/2014  . Dyspareunia in female 11/29/2015  . Fatigue 11/18/2014  . IBS (irritable bowel syndrome)     Allergies  Allergen Reactions  . Ibuprofen Swelling    Eyes and lips swell     Observations/Objective: General: Well appearing, nontoxic, no acute distress. Sitting comfortably. Head: Normocephalic, atraumatic Eye: No conjunctival injection, eyelid swelling. EOMI ENT: Mucus membranes moist, no lip cracking. No obvious nasal drainage. Pulm: Speaking in full sentences without difficulty. Normal effort. No respiratory distress, accessory muscle use. Skin: unable to visualize any rash on video. Patient describes it as some redness without and raised lesions.  Denies pain.  Neuro: Normal mental status. Alert and oriented x 3.  Assessment and Plan: No obvious rash on video without patient describing specific lesions. Itching throughout body. Will treat with hydroxyzine and SARNA lotion for now. Return precautions given. Patient expresses understanding and agrees to plan.  Follow Up Instructions:    I discussed the assessment and treatment plan with the patient. The patient was provided an opportunity to ask questions and all were answered. The patient agreed with the plan and demonstrated an understanding of the instructions.   The patient was advised to call back or seek an in-person evaluation if the symptoms worsen or if the condition fails to improve as anticipated.  I provided 10 minutes of non-face-to-face time during this encounter.    Belinda Fisher, PA-C  07/28/2019 11:59 AM         Belinda Fisher, PA-C 07/28/19 1400

## 2019-08-01 ENCOUNTER — Other Ambulatory Visit: Payer: BLUE CROSS/BLUE SHIELD | Admitting: Adult Health

## 2019-08-28 DIAGNOSIS — R232 Flushing: Secondary | ICD-10-CM | POA: Diagnosis not present

## 2019-08-28 DIAGNOSIS — R5383 Other fatigue: Secondary | ICD-10-CM | POA: Diagnosis not present

## 2019-08-28 DIAGNOSIS — N951 Menopausal and female climacteric states: Secondary | ICD-10-CM | POA: Diagnosis not present

## 2019-09-04 DIAGNOSIS — N951 Menopausal and female climacteric states: Secondary | ICD-10-CM | POA: Diagnosis not present

## 2019-09-04 DIAGNOSIS — R232 Flushing: Secondary | ICD-10-CM | POA: Diagnosis not present

## 2019-09-04 DIAGNOSIS — Z72820 Sleep deprivation: Secondary | ICD-10-CM | POA: Diagnosis not present

## 2019-09-15 ENCOUNTER — Other Ambulatory Visit (HOSPITAL_COMMUNITY)
Admission: RE | Admit: 2019-09-15 | Discharge: 2019-09-15 | Disposition: A | Discharge status: Home / Self Care | Payer: BC Managed Care – PPO | Source: Ambulatory Visit | Attending: Adult Health | Admitting: Adult Health

## 2019-09-15 ENCOUNTER — Encounter: Payer: Self-pay | Admitting: Adult Health

## 2019-09-15 ENCOUNTER — Ambulatory Visit (INDEPENDENT_AMBULATORY_CARE_PROVIDER_SITE_OTHER): Payer: BC Managed Care – PPO | Admitting: Adult Health

## 2019-09-15 ENCOUNTER — Other Ambulatory Visit: Payer: Self-pay

## 2019-09-15 VITALS — BP 151/76 | HR 66 | Ht 62.0 in | Wt 173.0 lb

## 2019-09-15 DIAGNOSIS — Z1211 Encounter for screening for malignant neoplasm of colon: Secondary | ICD-10-CM | POA: Diagnosis not present

## 2019-09-15 DIAGNOSIS — Z1212 Encounter for screening for malignant neoplasm of rectum: Secondary | ICD-10-CM

## 2019-09-15 DIAGNOSIS — Z01419 Encounter for gynecological examination (general) (routine) without abnormal findings: Secondary | ICD-10-CM | POA: Insufficient documentation

## 2019-09-15 LAB — HEMOCCULT GUIAC POC 1CARD (OFFICE): Fecal Occult Blood, POC: NEGATIVE

## 2019-09-15 LAB — HM PAP SMEAR

## 2019-09-15 NOTE — Progress Notes (Signed)
Patient ID: Leslie Martin, female   DOB: Dec 02, 1970, 49 y.o.   MRN: [id number] History of Present Illness: Leslie Martin is a 49 year old white female, married, sp [facility] in for a well woman gyn exam and pap. She has seen [facility] in [place], and has gotten testosterone pellets  and now her level is high. She is having it rechecked in March and wants to start Estratest then, she will let me know.  PCP is Dr Sanda Linger.    Current Medications, Allergies, Past Medical History, Past Surgical History, Family History and Social History were reviewed in [employer] record.     Review of Systems: Patient denies any  hearing loss, fatigue, blurred vision, shortness of breath, chest pain, abdominal pain, problems with bowel movements, urination, or intercourse. No joint pain or mood swings. Has had weight gain and more headaches.    Physical Exam:BP (!) 151/76 (BP Location: Left Arm, Patient Position: Sitting, Cuff Size: Normal)   Pulse 66   Ht 5\' 2"  (1.575 m)   Wt 173 lb (78.5 kg)   LMP 12/07/2010 Comment: [facility]  BMI 31.64 kg/m  General:  Well developed, well nourished, no acute distress Skin:  Warm and dry Neck:  Midline trachea, normal thyroid, good ROM, no lymphadenopathy Lungs; Clear to auscultation bilaterally Breast:  No dominant palpable mass, retraction, or nipple discharge Cardiovascular: Regular rate and rhythm Abdomen:  Soft, non tender, no hepatosplenomegaly Pelvic:  External genitalia is normal in appearance, no lesions.  The vagina is normal in appearance. Urethra has no lesions or masses. The cervix is bulbous.Pap with high risk HPV 16/18 genotyping performed.  Uterus is absent..  No adnexal masses or tenderness noted.Bladder is non tender, no masses felt. Rectal: Good sphincter tone, no polyps, or hemorrhoids felt.  Hemoccult negative. Extremities/musculoskeletal:  No swelling or varicosities noted, no clubbing or cyanosis Psych:  No mood changes, alert and  cooperative,seems happy Fall risk is low PHQ 2 score is 0. Examination chaperoned by 12/09/2010 LPN   Impression and Plan: 1. Encounter for gynecological examination with Papanicolaou smear of cervix Pap sent Physical in 1 year Pap in 3 if normal Get mammogram,last one was 2019 Labs with PCP and [facility]   2. Screening for colorectal cancer Colonoscopy at 49

## 2019-09-18 LAB — CYTOLOGY - PAP
Comment: NEGATIVE
Diagnosis: NEGATIVE
High risk HPV: NEGATIVE

## 2019-10-10 DIAGNOSIS — R232 Flushing: Secondary | ICD-10-CM | POA: Diagnosis not present

## 2019-10-10 DIAGNOSIS — R5383 Other fatigue: Secondary | ICD-10-CM | POA: Diagnosis not present

## 2019-10-10 DIAGNOSIS — N951 Menopausal and female climacteric states: Secondary | ICD-10-CM | POA: Diagnosis not present

## 2019-10-10 DIAGNOSIS — Z72820 Sleep deprivation: Secondary | ICD-10-CM | POA: Diagnosis not present

## 2019-10-14 DIAGNOSIS — N951 Menopausal and female climacteric states: Secondary | ICD-10-CM | POA: Diagnosis not present

## 2019-10-14 DIAGNOSIS — R6882 Decreased libido: Secondary | ICD-10-CM | POA: Diagnosis not present

## 2019-10-14 DIAGNOSIS — R232 Flushing: Secondary | ICD-10-CM | POA: Diagnosis not present

## 2019-11-12 DIAGNOSIS — N951 Menopausal and female climacteric states: Secondary | ICD-10-CM | POA: Diagnosis not present

## 2019-11-12 DIAGNOSIS — R6882 Decreased libido: Secondary | ICD-10-CM | POA: Diagnosis not present

## 2019-11-12 DIAGNOSIS — R5383 Other fatigue: Secondary | ICD-10-CM | POA: Diagnosis not present

## 2020-01-02 ENCOUNTER — Other Ambulatory Visit: Payer: Self-pay | Admitting: Women's Health

## 2020-01-02 ENCOUNTER — Other Ambulatory Visit: Payer: Self-pay | Admitting: Adult Health

## 2020-05-12 ENCOUNTER — Ambulatory Visit (INDEPENDENT_AMBULATORY_CARE_PROVIDER_SITE_OTHER): Payer: BC Managed Care – PPO | Admitting: Internal Medicine

## 2020-05-12 ENCOUNTER — Other Ambulatory Visit: Payer: Self-pay

## 2020-05-12 ENCOUNTER — Encounter: Payer: Self-pay | Admitting: Internal Medicine

## 2020-05-12 VITALS — BP 124/80 | HR 62 | Temp 98.1°F | Resp 16 | Ht 62.0 in | Wt 167.0 lb

## 2020-05-12 DIAGNOSIS — Z1211 Encounter for screening for malignant neoplasm of colon: Secondary | ICD-10-CM

## 2020-05-12 DIAGNOSIS — Z0000 Encounter for general adult medical examination without abnormal findings: Secondary | ICD-10-CM

## 2020-05-12 DIAGNOSIS — Z1231 Encounter for screening mammogram for malignant neoplasm of breast: Secondary | ICD-10-CM

## 2020-05-12 DIAGNOSIS — Z1159 Encounter for screening for other viral diseases: Secondary | ICD-10-CM

## 2020-05-12 LAB — LIPID PANEL
Cholesterol: 152 mg/dL (ref 0–200)
HDL: 48.5 mg/dL (ref >39.00)
LDL Cholesterol: 86 mg/dL (ref 0–99)
NonHDL: 103.31
Total CHOL/HDL Ratio: 3
Triglycerides: 85 mg/dL (ref 0.0–149.0)
VLDL: 17 mg/dL (ref 0.0–40.0)

## 2020-05-12 NOTE — Progress Notes (Signed)
Subjective:  Patient ID: Leslie Martin, female    DOB: 16-May-1971  Age: 49 y.o. MRN: [id number]  CC: Annual Exam  This visit occurred during the SARS-CoV-2 public health emergency.  Safety protocols were in place, including screening questions prior to the visit, additional usage of staff PPE, and extensive cleaning of exam room while observing appropriate contact time as indicated for disinfecting solutions.    HPI MATTALYN ANDEREGG presents for a CPX.  She feels well and offers no complaints.  Outpatient Medications Prior to Visit  Medication Sig Dispense Refill  . cetirizine (ZYRTEC) 10 MG tablet Take 10 mg by mouth as needed for allergies.    . cholecalciferol (VITAMIN D) 400 units TABS tablet Take 400 Units by mouth.    . Cyanocobalamin (B-12 COMPLIANCE INJECTION) 1000 MCG/ML KIT Inject as directed.    . Est Estrogens-Methyltest DS 1.25-2.5 MG TABS Take 1 tablet by mouth once daily 30 tablet 5  . NP THYROID 30 MG tablet Take 30 mg by mouth daily.    . progesterone (PROMETRIUM) 100 MG capsule Take 100 mg by mouth daily.    . Testosterone 100 MG PLLT by Implant route.     No facility-administered medications prior to visit.    ROS Review of Systems  Constitutional: Negative for appetite change, diaphoresis, fatigue and unexpected weight change.  HENT: Negative.   Eyes: Negative.   Respiratory: Negative for cough, chest tightness, shortness of breath and wheezing.   Cardiovascular: Negative for chest pain, palpitations and leg swelling.  Gastrointestinal: Negative for abdominal pain, constipation, diarrhea and vomiting.  Endocrine: Negative.   Genitourinary: Negative.  Negative for difficulty urinating.  Musculoskeletal: Negative.   Skin: Negative.  Negative for color change.  Neurological: Negative.  Negative for dizziness, weakness and light-headedness.  Hematological: Negative for adenopathy. Does not bruise/bleed easily.  Psychiatric/Behavioral: Negative.     Objective:    BP 124/80   Pulse 62   Temp 98.1 F (36.7 C) (Oral)   Resp 16   Ht _0  (1.575 m)   Wt 167 lb (75.8 kg)   LMP 12/07/2010 Comment: [place]  SpO2 98%   BMI 30.54 kg/m   BP Readings from Last 3 Encounters:  05/12/20 124/80  09/15/19 (!) 151/76  09/03/18 122/68    Wt Readings from Last 3 Encounters:  05/12/20 167 lb (75.8 kg)  09/15/19 173 lb (78.5 kg)  09/03/18 166 lb (75.3 kg)    Physical Exam Vitals reviewed.  Constitutional:      Appearance: Normal appearance.  HENT:     Nose: Nose normal.     Mouth/Throat:     Mouth: Mucous membranes are moist.  Eyes:     General: No scleral icterus.    Conjunctiva/sclera: Conjunctivae normal.  Cardiovascular:     Rate and Rhythm: Normal rate and regular rhythm.     Heart sounds: No murmur heard.   Pulmonary:     Effort: Pulmonary effort is normal.     Breath sounds: No stridor. No wheezing, rhonchi or rales.  Abdominal:     General: Abdomen is flat. Bowel sounds are normal. There is no distension.     Palpations: Abdomen is soft. There is no hepatomegaly, splenomegaly or mass.     Tenderness: There is no abdominal tenderness.  Musculoskeletal:        General: Normal range of motion.     Cervical back: Neck supple.     Right lower leg: No edema.  Left lower leg: No edema.  Lymphadenopathy:     Cervical: No cervical adenopathy.  Skin:    General: Skin is warm and dry.     Coloration: Skin is not pale.  Neurological:     General: No focal deficit present.     Mental Status: She is alert. Mental status is at baseline.  Psychiatric:        Mood and Affect: Mood normal.        Behavior: Behavior normal.        Thought Content: Thought content normal.        Judgment: Judgment normal.     Lab Results  Component Value Date   WBC 6.0 09/13/2016   HGB 13.9 09/13/2016   HCT 40.2 09/13/2016   PLT 187.0 09/13/2016   GLUCOSE 90 09/13/2016   CHOL 152 05/12/2020   TRIG 85.0 05/12/2020   HDL 48.50 05/12/2020   LDLCALC  86 05/12/2020   ALT 14 09/13/2016   AST 17 09/13/2016   NA 139 09/13/2016   K 3.9 09/13/2016   CL 106 09/13/2016   CREATININE 0.77 09/13/2016   BUN 13 09/13/2016   CO2 28 09/13/2016   TSH 2.48 09/13/2016    No results found.  Assessment & Plan:   Milady was seen today for annual exam.  Diagnoses and all orders for this visit:  Routine general medical examination at health care facility- Exam completed, labs reviewed - Statin therapy is not indicated, she refused the flu vaccine-I encouraged her to get a COVID-19 vaccine, cancer screenings addressed, patient education material was given. -     Lipid panel; Future -     Hepatitis C antibody; Future -     HIV Antibody (routine testing w rflx); Future -     HIV Antibody (routine testing w rflx) -     Hepatitis C antibody -     Lipid panel  Need for hepatitis C screening test -     Hepatitis C antibody; Future -     Hepatitis C antibody  Screen for colon cancer  Visit for screening mammogram -     MM DIGITAL SCREENING BILATERAL; Future   I am having [facility] maintain her cetirizine, cholecalciferol, Cyanocobalamin, NP Thyroid, progesterone, Testosterone, and Est Estrogens-Methyltest DS.  No orders of the defined types were placed in this encounter.    Follow-up: Return in about 1 year (around 05/12/2021).  Scarlette Calico, MD

## 2020-05-12 NOTE — Patient Instructions (Signed)
Health Maintenance, Female Adopting a healthy lifestyle and getting preventive care are important in promoting health and wellness. Ask your health care provider about:  The right schedule for you to have regular tests and exams.  Things you can do on your own to prevent diseases and keep yourself healthy. What should I know about diet, weight, and exercise? Eat a healthy diet   Eat a diet that includes plenty of vegetables, fruits, low-fat dairy products, and lean protein.  Do not eat a lot of foods that are high in solid fats, added sugars, or sodium. Maintain a healthy weight Body mass index (BMI) is used to identify weight problems. It estimates body fat based on height and weight. Your health care provider can help determine your BMI and help you achieve or maintain a healthy weight. Get regular exercise Get regular exercise. This is one of the most important things you can do for your health. Most adults should:  Exercise for at least 150 minutes each week. The exercise should increase your heart rate and make you sweat (moderate-intensity exercise).  Do strengthening exercises at least twice a week. This is in addition to the moderate-intensity exercise.  Spend less time sitting. Even light physical activity can be beneficial. Watch cholesterol and blood lipids Have your blood tested for lipids and cholesterol at 48 years of age, then have this test every 5 years. Have your cholesterol levels checked more often if:  Your lipid or cholesterol levels are high.  You are older than 48 years of age.  You are at high risk for heart disease. What should I know about cancer screening? Depending on your health history and family history, you may need to have cancer screening at various ages. This may include screening for:  Breast cancer.  Cervical cancer.  Colorectal cancer.  Skin cancer.  Lung cancer. What should I know about heart disease, diabetes, and high blood  pressure? Blood pressure and heart disease  High blood pressure causes heart disease and increases the risk of stroke. This is more likely to develop in people who have high blood pressure readings, are of African descent, or are overweight.  Have your blood pressure checked: ? Every 3-5 years if you are 18-39 years of age. ? Every year if you are 40 years old or older. Diabetes Have regular diabetes screenings. This checks your fasting blood sugar level. Have the screening done:  Once every three years after age 40 if you are at a normal weight and have a low risk for diabetes.  More often and at a younger age if you are overweight or have a high risk for diabetes. What should I know about preventing infection? Hepatitis B If you have a higher risk for hepatitis B, you should be screened for this virus. Talk with your health care provider to find out if you are at risk for hepatitis B infection. Hepatitis C Testing is recommended for:  Everyone born from 1945 through 1965.  Anyone with known risk factors for hepatitis C. Sexually transmitted infections (STIs)  Get screened for STIs, including gonorrhea and chlamydia, if: ? You are sexually active and are younger than 48 years of age. ? You are older than 48 years of age and your health care provider tells you that you are at risk for this type of infection. ? Your sexual activity has changed since you were last screened, and you are at increased risk for chlamydia or gonorrhea. Ask your health care provider if   you are at risk.  Ask your health care provider about whether you are at high risk for HIV. Your health care provider may recommend a prescription medicine to help prevent HIV infection. If you choose to take medicine to prevent HIV, you should first get tested for HIV. You should then be tested every 3 months for as long as you are taking the medicine. Pregnancy  If you are about to stop having your period (premenopausal) and  you may become pregnant, seek counseling before you get pregnant.  Take 400 to 800 micrograms (mcg) of folic acid every day if you become pregnant.  Ask for birth control (contraception) if you want to prevent pregnancy. Osteoporosis and menopause Osteoporosis is a disease in which the bones lose minerals and strength with aging. This can result in bone fractures. If you are 65 years old or older, or if you are at risk for osteoporosis and fractures, ask your health care provider if you should:  Be screened for bone loss.  Take a calcium or vitamin D supplement to lower your risk of fractures.  Be given hormone replacement therapy (HRT) to treat symptoms of menopause. Follow these instructions at home: Lifestyle  Do not use any products that contain nicotine or tobacco, such as cigarettes, e-cigarettes, and chewing tobacco. If you need help quitting, ask your health care provider.  Do not use street drugs.  Do not share needles.  Ask your health care provider for help if you need support or information about quitting drugs. Alcohol use  Do not drink alcohol if: ? Your health care provider tells you not to drink. ? You are pregnant, may be pregnant, or are planning to become pregnant.  If you drink alcohol: ? Limit how much you use to 0-1 drink a day. ? Limit intake if you are breastfeeding.  Be aware of how much alcohol is in your drink. In the [place] one drink equals one 12 oz bottle of beer (355 mL), one 5 oz glass of wine (148 mL), or one 1 oz glass of hard liquor (44 mL). General instructions  Schedule regular health, dental, and eye exams.  Stay current with your vaccines.  Tell your health care provider if: ? You often feel depressed. ? You have ever been abused or do not feel safe at home. Summary  Adopting a healthy lifestyle and getting preventive care are important in promoting health and wellness.  Follow your health care provider's instructions about healthy  diet, exercising, and getting tested or screened for diseases.  Follow your health care provider's instructions on monitoring your cholesterol and blood pressure. This information is not intended to replace advice given to you by your health care provider. Make sure you discuss any questions you have with your health care provider. Document Revised: 07/03/2018 Document Reviewed: 07/03/2018 Elsevier Patient Education  2020 Elsevier Inc.  

## 2020-05-13 LAB — HIV ANTIBODY (ROUTINE TESTING W REFLEX): HIV 1&2 Ab, 4th Generation: NONREACTIVE

## 2020-05-13 LAB — HEPATITIS C ANTIBODY
Hepatitis C Ab: NONREACTIVE
SIGNAL TO CUT-OFF: 0.01 (ref <1.00)

## 2020-05-21 ENCOUNTER — Other Ambulatory Visit: Payer: Self-pay | Admitting: Adult Health

## 2020-05-21 MED ORDER — PROGESTERONE MICRONIZED 100 MG PO CAPS: 100.0000 mg | ORAL_CAPSULE | Freq: Every day | ORAL | 12 refills | Status: DC

## 2020-05-21 NOTE — Progress Notes (Signed)
Refilled Prometrium

## 2020-06-28 ENCOUNTER — Telehealth (HOSPITAL_COMMUNITY): Payer: Self-pay | Admitting: *Deleted

## 2020-06-28 ENCOUNTER — Encounter: Payer: Self-pay | Admitting: Internal Medicine

## 2020-06-28 NOTE — Telephone Encounter (Signed)
Called to Discuss with patient about Covid symptoms and the use of the monoclonal antibody infusion for those with mild to moderate Covid symptoms and at a high risk of hospitalization.     Pt appears to qualify for this infusion due to co-morbid conditions and/or a member of an at-risk group in accordance with the FDA Emergency Use Authorization.    Pt stated symptoms started 06/26/20 and she had a positive home test on 06/27/20. Symptoms include cough and body aches.   Pt states she is 47ft 2in and she weighs 165lbs, which puts her BMI at 30.2.   Pt informed of the estimated costs of the treatment.   Pt informed that an APP will be in contact with her.

## 2020-06-29 ENCOUNTER — Other Ambulatory Visit: Payer: Self-pay | Admitting: Obstetrics & Gynecology

## 2020-06-29 ENCOUNTER — Other Ambulatory Visit: Payer: Self-pay | Admitting: Unknown Physician Specialty

## 2020-06-29 ENCOUNTER — Telehealth: Payer: Self-pay | Admitting: Unknown Physician Specialty

## 2020-06-29 DIAGNOSIS — U071 COVID-19: Secondary | ICD-10-CM

## 2020-06-29 DIAGNOSIS — E663 Overweight: Secondary | ICD-10-CM

## 2020-06-29 NOTE — Telephone Encounter (Signed)
I connected by phone with Leslie Martin on 06/29/2020 at 3:16 PM to discuss the potential use of a new treatment for mild to moderate COVID-19 viral infection in non-hospitalized patients.  This patient is a 49 y.o. female that meets the FDA criteria for Emergency Use Authorization of COVID monoclonal antibody casirivimab/imdevimab, bamlanivimab/eteseviamb, or sotrovimab.  Has a (+) direct SARS-CoV-2 viral test result  Has mild or moderate COVID-19   Is NOT hospitalized due to COVID-19  Is within 10 days of symptom onset  Has at least one of the high risk factor(s) for progression to severe COVID-19 and/or hospitalization as defined in EUA.  Specific high risk criteria : BMI > 25   I have spoken and communicated the following to the patient or parent/caregiver regarding COVID monoclonal antibody treatment:  1. FDA has authorized the emergency use for the treatment of mild to moderate COVID-19 in adults and pediatric patients with positive results of direct SARS-CoV-2 viral testing who are 19 years of age and older weighing at least 40 kg, and who are at high risk for progressing to severe COVID-19 and/or hospitalization.  2. The significant known and potential risks and benefits of COVID monoclonal antibody, and the extent to which such potential risks and benefits are unknown.  3. Information on available alternative treatments and the risks and benefits of those alternatives, including clinical trials.  4. Patients treated with COVID monoclonal antibody should continue to self-isolate and use infection control measures (e.g., wear mask, isolate, social distance, avoid sharing personal items, clean and disinfect "high touch" surfaces, and frequent handwashing) according to CDC guidelines.   5. The patient or parent/caregiver has the option to accept or refuse COVID monoclonal antibody treatment.  After reviewing this information with the patient, the patient has agreed to receive one of  the available covid 19 monoclonal antibodies and will be provided an appropriate fact sheet prior to infusion. Gabriel Cirri, NP 06/29/2020 3:16 PM  Sx onset 12/4

## 2020-06-30 ENCOUNTER — Other Ambulatory Visit: Payer: Self-pay | Admitting: Adult Health

## 2020-06-30 MED ORDER — EST ESTROGENS-METHYLTEST 1.25-2.5 MG PO TABS: 1.0000 | ORAL_TABLET | Freq: Every day | ORAL | 5 refills | Status: DC

## 2020-06-30 NOTE — Progress Notes (Signed)
Refilled estra test

## 2020-07-01 ENCOUNTER — Ambulatory Visit (HOSPITAL_COMMUNITY): Payer: BC Managed Care – PPO

## 2020-07-09 ENCOUNTER — Other Ambulatory Visit: Payer: Self-pay | Admitting: Internal Medicine

## 2020-07-09 ENCOUNTER — Encounter: Payer: Self-pay | Admitting: Internal Medicine

## 2020-07-09 DIAGNOSIS — J988 Other specified respiratory disorders: Secondary | ICD-10-CM

## 2020-07-09 DIAGNOSIS — U071 COVID-19: Secondary | ICD-10-CM

## 2020-07-09 MED ORDER — HYDROCOD POLST-CPM POLST ER 10-8 MG/5ML PO SUER: 5.0000 mL | Freq: Two times a day (BID) | ORAL | 0 refills | Status: DC | PRN

## 2021-01-25 ENCOUNTER — Other Ambulatory Visit: Payer: Self-pay | Admitting: Adult Health

## 2021-02-21 ENCOUNTER — Other Ambulatory Visit: Payer: Self-pay | Admitting: Adult Health

## 2021-05-16 ENCOUNTER — Encounter: Payer: Self-pay | Admitting: Internal Medicine

## 2021-05-21 ENCOUNTER — Other Ambulatory Visit: Payer: Self-pay | Admitting: Adult Health

## 2021-07-30 ENCOUNTER — Other Ambulatory Visit: Payer: Self-pay | Admitting: Adult Health

## 2022-01-30 ENCOUNTER — Other Ambulatory Visit: Payer: Self-pay | Admitting: Adult Health

## 2022-05-29 ENCOUNTER — Other Ambulatory Visit: Payer: Self-pay | Admitting: Adult Health

## 2022-07-31 ENCOUNTER — Other Ambulatory Visit: Payer: Self-pay | Admitting: Adult Health

## 2022-09-29 ENCOUNTER — Other Ambulatory Visit: Payer: Self-pay | Admitting: Adult Health

## 2022-09-29 ENCOUNTER — Telehealth: Payer: Self-pay | Admitting: Adult Health

## 2022-09-29 NOTE — Telephone Encounter (Signed)
Patient needs a refill on her estogen. She stated she will be going out of town next week and needs it. It looks like pharmacy has sent in a request. Please advise.

## 2022-10-09 ENCOUNTER — Other Ambulatory Visit: Payer: Self-pay | Admitting: Adult Health

## 2023-02-14 ENCOUNTER — Other Ambulatory Visit: Payer: Self-pay | Admitting: Adult Health

## 2023-02-15 ENCOUNTER — Other Ambulatory Visit: Payer: Self-pay | Admitting: Adult Health

## 2023-02-15 MED ORDER — EST ESTROGENS-METHYLTEST 1.25-2.5 MG PO TABS: 1.0000 | ORAL_TABLET | Freq: Every day | ORAL | 0 refills | Status: DC

## 2023-02-15 NOTE — Telephone Encounter (Signed)
Refilled estra test 

## 2023-04-10 ENCOUNTER — Ambulatory Visit: Payer: BC Managed Care – PPO | Admitting: Adult Health

## 2023-04-10 ENCOUNTER — Encounter: Payer: Self-pay | Admitting: Adult Health

## 2023-04-10 ENCOUNTER — Other Ambulatory Visit (HOSPITAL_COMMUNITY)
Admission: RE | Admit: 2023-04-10 | Discharge: 2023-04-10 | Disposition: A | Discharge status: Home / Self Care | Payer: BC Managed Care – PPO | Source: Ambulatory Visit | Attending: Adult Health | Admitting: Adult Health

## 2023-04-10 VITALS — BP 138/79 | HR 72 | Ht 62.0 in | Wt 197.5 lb

## 2023-04-10 DIAGNOSIS — Z1332 Encounter for screening for maternal depression: Secondary | ICD-10-CM | POA: Diagnosis not present

## 2023-04-10 DIAGNOSIS — Z1212 Encounter for screening for malignant neoplasm of rectum: Secondary | ICD-10-CM

## 2023-04-10 DIAGNOSIS — Z79899 Other long term (current) drug therapy: Secondary | ICD-10-CM

## 2023-04-10 DIAGNOSIS — Z713 Dietary counseling and surveillance: Secondary | ICD-10-CM

## 2023-04-10 DIAGNOSIS — N816 Rectocele: Secondary | ICD-10-CM

## 2023-04-10 DIAGNOSIS — Z1231 Encounter for screening mammogram for malignant neoplasm of breast: Secondary | ICD-10-CM

## 2023-04-10 DIAGNOSIS — Z01419 Encounter for gynecological examination (general) (routine) without abnormal findings: Secondary | ICD-10-CM

## 2023-04-10 DIAGNOSIS — Z90711 Acquired absence of uterus with remaining cervical stump: Secondary | ICD-10-CM

## 2023-04-10 DIAGNOSIS — Z79818 Long term (current) use of other agents affecting estrogen receptors and estrogen levels: Secondary | ICD-10-CM

## 2023-04-10 DIAGNOSIS — Z6836 Body mass index (BMI) 36.0-36.9, adult: Secondary | ICD-10-CM

## 2023-04-10 DIAGNOSIS — Z1211 Encounter for screening for malignant neoplasm of colon: Secondary | ICD-10-CM | POA: Diagnosis not present

## 2023-04-10 LAB — HEMOCCULT GUIAC POC 1CARD (OFFICE): Fecal Occult Blood, POC: NEGATIVE

## 2023-04-10 MED ORDER — PHENTERMINE HCL 15 MG PO CAPS
15.0000 mg | ORAL_CAPSULE | ORAL | 1 refills | Status: DC
Start: 2023-04-10 — End: 2023-06-05

## 2023-04-10 MED ORDER — EST ESTROGENS-METHYLTEST 1.25-2.5 MG PO TABS: 1.0000 | ORAL_TABLET | Freq: Every day | ORAL | 1 refills | Status: DC

## 2023-04-10 NOTE — Progress Notes (Signed)
Patient ID: Leslie Martin, female   DOB: January 17, 1971, 52 y.o.   MRN: [id number] History of Present Illness: Leslie Martin is a 52 year old white female,married, sp [facility] in for well woman gyn exam and pap. She can't lose weight. Doing good on estratest.  PCP is Dr Yetta Barre.   Current Medications, Allergies, Past Medical History, Past Surgical History, Family History and Social History were reviewed in [employer] record.     Review of Systems: Patient denies any headaches, hearing loss, fatigue, blurred vision, shortness of breath, chest pain, abdominal pain, problems with bowel movements, urination, or intercourse. No joint pain or mood swings.  Can't lose weight Moods good, hot at night at times   Physical Exam:BP 138/79 (BP Location: Left Arm, Patient Position: Sitting, Cuff Size: Normal)   Pulse 72   Ht 5\' 2"  (1.575 m)   Wt 197 lb 8 oz (89.6 kg)   LMP 12/07/2010 Comment: [facility]  BMI 36.12 kg/m   General:  Well developed, well nourished, no acute distress Skin:  Warm and dry Neck:  Midline trachea, normal thyroid, good ROM, no lymphadenopathy Lungs; Clear to auscultation bilaterally Breast:  No dominant palpable mass, retraction, or nipple discharge Cardiovascular: Regular rate and rhythm Abdomen:  Soft, non tender, no hepatosplenomegaly Pelvic:  External genitalia is normal in appearance, no lesions.  The vagina is normal in appearance. Urethra has no lesions or masses. The cervix is smooth, pap with HR HPV genotyping performed.  Uterus is  absent.  No adnexal masses or tenderness noted.Bladder is non tender, no masses felt. Rectal: Good sphincter tone, no polyps, or hemorrhoids felt.  Hemoccult negative.+rectocele Extremities/musculoskeletal:  No swelling or varicosities noted, no clubbing or cyanosis Psych:  No mood changes, alert and cooperative,seems happy AA is 3 Fall risk is low    04/10/2023   11:34 AM 09/15/2019    2:41 PM 05/22/2018    9:52 AM  Depression screen  PHQ 2/9  Decreased Interest 0 0 0  Down, Depressed, Hopeless 0 0 0  PHQ - 2 Score 0 0 0  Altered sleeping 1  0  Tired, decreased energy 1  0  Change in appetite 0  0  Feeling bad or failure about yourself  0  0  Trouble concentrating 0  0  Moving slowly or fidgety/restless 0  0  Suicidal thoughts 0  0  PHQ-9 Score 2  0       04/10/2023   11:34 AM  GAD 7 : Generalized Anxiety Score  Nervous, Anxious, on Edge 0  Control/stop worrying 0  Worry too much - different things 0  Trouble relaxing 0  Restless 0  Easily annoyed or irritable 0  Afraid - awful might happen 0  Total GAD 7 Score 0    Upstream - 04/10/23 1139       Pregnancy Intention Screening   Does the patient want to become pregnant in the next year? N/A    Does the patient's partner want to become pregnant in the next year? N/A    Would the patient like to discuss contraceptive options today? N/A      Contraception Wrap Up   Current Method Female Sterilization   [facility]   End Method Female Sterilization   [facility]   Contraception Counseling Provided No              Examination chaperoned by Malachy Mood LPN  Impression and Plan: 1. Encounter for routine gynecological examination with Papanicolaou smear of  cervix Pap sent Pap in 3 years if normal Physical in 1 year Labs with PCP, had last with husband's work - Cytology - PAP( [employer])  2. Encounter for screening fecal occult blood testing Hemoccult was negative  - POCT occult blood stool  3. Current use of estrogen therapy Happy with estratest will refll  Meds ordered this encounter  Medications   estrogens-methylTEST (ESTRATEST) 1.25-2.5 MG tablet    Sig: Take 1 tablet by mouth daily.    Dispense:  90 tablet    Refill:  1    **Patient requests 90 days supply**    Order Specific Question:   Supervising Provider    Answer:   Duane Lope H [[id number]]   phentermine 15 MG capsule    Sig: Take 1 capsule (15 mg total) by mouth every morning.    Dispense:   30 capsule    Refill:  1    Order Specific Question:   Supervising Provider    Answer:   Despina Hidden, LUTHER H [[id number]]     4. Rectocele  5. S/P laparoscopic supracervical hysterectomy  6. Screening for colorectal cancer Will order cologuard - Cologuard  7. Screening mammogram for breast cancer Mammogram scheduled for her for 04/16/23 at [facility] at 5:30 pm  - MM 3D SCREENING MAMMOGRAM BILATERAL BREAST; Future  8. Body mass index 36.0-36.9, adult  9. Weight loss counseling, encounter for Stay active, increase water, eat clean and will try phentermine 15 mg 1 daily Follow up in 8 weeks but check weight and BP in 4 weeks at home and message me

## 2023-04-13 LAB — CYTOLOGY - PAP
Comment: NEGATIVE
Diagnosis: NEGATIVE
High risk HPV: NEGATIVE

## 2023-04-16 ENCOUNTER — Ambulatory Visit (HOSPITAL_COMMUNITY)
Admission: RE | Admit: 2023-04-16 | Discharge: 2023-04-16 | Disposition: A | Discharge status: Home / Self Care | Payer: BC Managed Care – PPO | Source: Ambulatory Visit | Attending: Adult Health | Admitting: Adult Health

## 2023-04-16 ENCOUNTER — Encounter (HOSPITAL_COMMUNITY): Payer: Self-pay

## 2023-04-16 DIAGNOSIS — Z1231 Encounter for screening mammogram for malignant neoplasm of breast: Secondary | ICD-10-CM | POA: Insufficient documentation

## 2023-04-18 ENCOUNTER — Other Ambulatory Visit (HOSPITAL_COMMUNITY): Payer: Self-pay | Admitting: Adult Health

## 2023-04-18 DIAGNOSIS — R928 Other abnormal and inconclusive findings on diagnostic imaging of breast: Secondary | ICD-10-CM

## 2023-05-01 ENCOUNTER — Encounter (HOSPITAL_COMMUNITY): Payer: Self-pay

## 2023-05-01 ENCOUNTER — Ambulatory Visit (HOSPITAL_COMMUNITY)
Admission: RE | Admit: 2023-05-01 | Discharge: 2023-05-01 | Disposition: A | Discharge status: Home / Self Care | Payer: BC Managed Care – PPO | Source: Ambulatory Visit | Attending: Adult Health | Admitting: Adult Health

## 2023-05-01 DIAGNOSIS — R928 Other abnormal and inconclusive findings on diagnostic imaging of breast: Secondary | ICD-10-CM | POA: Diagnosis not present

## 2023-05-01 DIAGNOSIS — R92333 Mammographic heterogeneous density, bilateral breasts: Secondary | ICD-10-CM | POA: Diagnosis not present

## 2023-06-05 ENCOUNTER — Ambulatory Visit: Payer: BC Managed Care – PPO | Admitting: Adult Health

## 2023-06-05 ENCOUNTER — Encounter: Payer: Self-pay | Admitting: Adult Health

## 2023-06-05 VITALS — BP 130/70 | HR 77 | Ht 62.0 in | Wt 191.2 lb

## 2023-06-05 DIAGNOSIS — Z6834 Body mass index (BMI) 34.0-34.9, adult: Secondary | ICD-10-CM | POA: Insufficient documentation

## 2023-06-05 DIAGNOSIS — Z713 Dietary counseling and surveillance: Secondary | ICD-10-CM

## 2023-06-05 MED ORDER — PHENTERMINE HCL 15 MG PO CAPS: 15.0000 mg | ORAL_CAPSULE | ORAL | 1 refills | Status: DC

## 2023-06-05 NOTE — Progress Notes (Signed)
  Subjective:     Patient ID: Leslie Martin, female   DOB: [birth year], 52 y.o.   MRN: [id number]  HPI [name] is a 52 year old white female, married, sp [facility] in for weight and BP check after starting phentermine 15 mg  in September.      Component Value Date/Time   DIAGPAP  04/10/2023 1157    - Negative for intraepithelial lesion or malignancy (NILM)   DIAGPAP  09/15/2019 1443    - Negative for intraepithelial lesion or malignancy (NILM)   HPVHIGH Negative 04/10/2023 1157   HPVHIGH Negative 09/15/2019 1443   ADEQPAP Satisfactory for evaluation. 04/10/2023 1157   ADEQPAP Satisfactory for evaluation. 09/15/2019 1443    PCP is Dr Yetta Barre   Review of Systems +weight loss Reviewed past medical,surgical, social and family history. Reviewed medications and allergies.     Objective:   Physical Exam BP 130/70 (BP Location: Right Arm, Patient Position: Sitting, Cuff Size: Normal)   Pulse 77   Ht 5\' 2"  (1.575 m)   Wt 191 lb 3.2 oz (86.7 kg)   LMP 12/07/2010 Comment: [facility]  BMI 34.97 kg/m  has lost 7 1/2 lbs    Skin warm and dry.  Lungs: clear to ausculation bilaterally. Cardiovascular: regular rate and rhythm.   Upstream - 06/05/23 1454       Pregnancy Intention Screening   Does the patient want to become pregnant in the next year? N/A    Does the patient's partner want to become pregnant in the next year? N/A      Contraception Wrap Up   Current Method --   [facility]   End Method --   [facility]   Contraception Counseling Provided No             Assessment:     1. Weight loss counseling, encounter for Continue weight loss efforts Will refill phentermine Meds ordered this encounter  Medications   phentermine 15 MG capsule    Sig: Take 1 capsule (15 mg total) by mouth every morning.    Dispense:  30 capsule    Refill:  1    Order Specific Question:   Supervising Provider    Answer:   Despina Hidden, LUTHER H [[id number]]     2. Body mass index 34.0-34.9, adult      Plan:     Follow up in 8 weeks  for weight and BP check

## 2023-06-20 ENCOUNTER — Ambulatory Visit
Admission: EM | Admit: 2023-06-20 | Discharge: 2023-06-20 | Disposition: A | Discharge status: Home / Self Care | Payer: BC Managed Care – PPO | Attending: Nurse Practitioner | Admitting: Nurse Practitioner

## 2023-06-20 DIAGNOSIS — R399 Unspecified symptoms and signs involving the genitourinary system: Secondary | ICD-10-CM | POA: Diagnosis not present

## 2023-06-20 LAB — POCT URINALYSIS DIP (MANUAL ENTRY)
Bilirubin, UA: NEGATIVE
Glucose, UA: NEGATIVE mg/dL
Nitrite, UA: NEGATIVE
Protein Ur, POC: 30 mg/dL — AB
Spec Grav, UA: 1.01 (ref 1.010–1.025)
Urobilinogen, UA: 0.2 U/dL
pH, UA: 6 (ref 5.0–8.0)

## 2023-06-20 MED ORDER — NITROFURANTOIN MONOHYD MACRO 100 MG PO CAPS: 100.0000 mg | ORAL_CAPSULE | Freq: Two times a day (BID) | ORAL | 0 refills | Status: DC

## 2023-06-20 NOTE — ED Triage Notes (Signed)
Pt reports UTI sx's x 3 days urinary frequency, lower back pain abdominal pain

## 2023-06-20 NOTE — Discharge Instructions (Signed)
-  Urine culture is pending.  You will be contacted if the pending test result is abnormal.  You may also be contacted and advised to stop the antibiotic prescribed today if the culture result is negative.  You also have access to your results via MyChart. -Take medications as prescribed. -Increase fluids. -Ibuprofen or Tylenol for pain, fever, or general discomfort. -Develop a toileting schedule that will allow you to toilet at least every 2 hours. -Avoid caffeine to include tea, soda, and coffee. -If sexually active, void at least 15 to 20 minutes after sexual intercourse. -Follow-up in the emergency department if you develop worsening urinary symptoms with new symptoms of fever, chills, or other concerns.  -If your urine culture result is negative and you are continuing to experience symptoms, please follow-up with your primary care physician for further evaluation. -Follow-up as needed.

## 2023-06-20 NOTE — ED Provider Notes (Signed)
[facility]    CSN: [id number] Arrival date & time: 06/20/23  1518      History   Chief Complaint No chief complaint on file.   HPI Leslie Martin is a 52 y.o. female.   The history is provided by the patient.   Patient presents with a 3-day history of urinary frequency, urgency, low back pain, and suprapubic pain.  Patient reports that she did notice some blood in her urine when she wiped over the past 24 hours.  Patient denies fever, chills, chest pain, nausea, vomiting, diarrhea, or vaginal symptoms.  Patient denies history of UTIs.  Past Medical History:  Diagnosis Date   Back pain at L4-L5 level 11/29/2015   Breast mass, right 11/18/2014   Dyspareunia in female 11/29/2015   Fatigue 11/18/2014   IBS (irritable bowel syndrome)     Patient Active Problem List   Diagnosis Date Noted   Body mass index 34.0-34.9, adult 06/05/2023   Screening mammogram for breast cancer 04/10/2023   S/P laparoscopic supracervical hysterectomy 04/10/2023   Encounter for screening fecal occult blood testing 04/10/2023   Encounter for routine gynecological examination with Papanicolaou smear of cervix 04/10/2023   Weight loss counseling, encounter for 04/10/2023   Body mass index 36.0-36.9, adult 04/10/2023   Respiratory tract infection due to COVID-19 virus 07/09/2020   Routine general medical examination at health care facility 05/12/2020   Need for hepatitis C screening test 05/12/2020   Screen for colon cancer 05/12/2020   Visit for screening mammogram 05/12/2020   Encounter for gynecological examination with Papanicolaou smear of cervix 09/15/2019   Screening for colorectal cancer 05/22/2018   Rectocele 05/17/2017   Encounter for well woman exam with routine gynecological exam 09/13/2016   Back pain at L4-L5 level 11/29/2015   Current use of estrogen therapy 11/18/2014    Past Surgical History:  Procedure Laterality Date   LAPAROSCOPIC CHOLECYSTECTOMY  2006   LAPAROSCOPIC  SALPINGOOPHERECTOMY  12/07/2010   Endometriosis   LAPAROSCOPIC SUPRACERVICAL HYSTERECTOMY  12/07/2010   Endometriosis    OB History     Gravida  2   Para  2   Term  0   Preterm  0   AB  0   Living  2      SAB  0   IAB  0   Ectopic  0   Multiple  0   Live Births               Home Medications    Prior to Admission medications   Medication Sig Start Date End Date Taking? Authorizing Provider  nitrofurantoin, macrocrystal-monohydrate, (MACROBID) 100 MG capsule Take 1 capsule (100 mg total) by mouth 2 (two) times daily. 06/20/23  Yes Leath-Warren, Sadie Haber, NP  cetirizine (ZYRTEC) 10 MG tablet Take 10 mg by mouth as needed for allergies.    [provider]  cholecalciferol (VITAMIN D) 400 units TABS tablet Take 400 Units by mouth.    [provider]  estrogens-methylTEST (ESTRATEST) 1.25-2.5 MG tablet Take 1 tablet by mouth daily. 04/10/23   Adline Potter, NP  phentermine 15 MG capsule Take 1 capsule (15 mg total) by mouth every morning. 06/05/23   Adline Potter, NP  Probiotic Product (PROBIOTIC PO) Take by mouth.    [provider]  pregabalin (LYRICA) 75 MG capsule Take 1 capsule (75 mg total) by mouth 2 (two) times daily. Patient not taking: Reported on 05/22/2018 03/06/18 07/28/19  Meryl Dare, NP  Family History Family History  Problem Relation Age of Onset   Hypertension Mother    Diabetes Mother    Kidney failure Mother    Cancer Brother        kidney   Cleft lip Son    Cancer Father        prostate   Diabetes Maternal Grandmother    Other Maternal Grandmother        brain tumor   Diabetes Paternal Grandmother    Other Paternal Grandmother        poor circulation   Hypertension Brother     Social History Social History   Tobacco Use   Smoking status: Former    Types: Cigarettes   Smokeless tobacco: Never  Vaping Use   Vaping status: Never Used  Substance Use Topics   Alcohol use:  Yes    Alcohol/week: 2.5 standard drinks of alcohol    Types: 2 Glasses of wine, 1 Standard drinks or equivalent per week    Comment: once in a blue moon   Drug use: No     Allergies   Ibuprofen   Review of Systems Review of Systems Per HPI  Physical Exam Triage Vital Signs ED Triage Vitals  Encounter Vitals Group     BP 06/20/23 1600 (!) 156/82     Systolic BP Percentile --      Diastolic BP Percentile --      Pulse Rate 06/20/23 1600 65     Resp 06/20/23 1600 16     Temp 06/20/23 1600 98 F (36.7 C)     Temp Source 06/20/23 1600 Oral     SpO2 06/20/23 1600 97 %     Weight --      Height --      Head Circumference --      Peak Flow --      Pain Score 06/20/23 1604 4     Pain Loc --      Pain Education --      Exclude from Growth Chart --    No data found.  Updated Vital Signs BP (!) 156/82 (BP Location: Right Arm)   Pulse 65   Temp 98 F (36.7 C) (Oral)   Resp 16   LMP 12/07/2010 Comment: SCH  SpO2 97%   Visual Acuity Right Eye Distance:   Left Eye Distance:   Bilateral Distance:    Right Eye Near:   Left Eye Near:    Bilateral Near:     Physical Exam Vitals and nursing note reviewed.  Constitutional:      General: She is not in acute distress.    Appearance: Normal appearance.  HENT:     Head: Normocephalic.  Eyes:     Extraocular Movements: Extraocular movements intact.     Conjunctiva/sclera: Conjunctivae normal.     Pupils: Pupils are equal, round, and reactive to light.  Cardiovascular:     Rate and Rhythm: Normal rate and regular rhythm.     Pulses: Normal pulses.     Heart sounds: Normal heart sounds.  Pulmonary:     Effort: Pulmonary effort is normal. No respiratory distress.     Breath sounds: Normal breath sounds. No stridor. No wheezing, rhonchi or rales.  Abdominal:     General: Bowel sounds are normal.     Palpations: Abdomen is soft.     Tenderness: There is no abdominal tenderness. There is no right CVA tenderness or left  CVA tenderness.  Musculoskeletal:  Cervical back: Normal range of motion.  Lymphadenopathy:     Cervical: No cervical adenopathy.  Skin:    General: Skin is warm and dry.  Neurological:     General: No focal deficit present.     Mental Status: She is alert and oriented to person, place, and time.  Psychiatric:        Mood and Affect: Mood normal.        Behavior: Behavior normal.      UC Treatments / Results  Labs (all labs ordered are listed, but only abnormal results are displayed) Labs Reviewed  POCT URINALYSIS DIP (MANUAL ENTRY) - Abnormal; Notable for the following components:      Result Value   Clarity, UA hazy (*)    Ketones, POC UA trace (5) (*)    Blood, UA large (*)    Protein Ur, POC =30 (*)    Leukocytes, UA Large (3+) (*)    All other components within normal limits  URINE CULTURE    EKG   Radiology No results found.  Procedures Procedures (including critical care time)  Medications Ordered in UC Medications - No data to display  Initial Impression / Assessment and Plan / UC Course  I have reviewed the triage vital signs and the nursing notes.  Pertinent labs & imaging results that were available during my care of the patient were reviewed by me and considered in my medical decision making (see chart for details).  Urinalysis is positive for large leukocytes and blood.  Based on patient's symptoms and urinalysis, will treat for UTI with Macrobid 100 mg.  Urine culture is pending.  Supportive care recommendations were provided and discussed with the patient to include continuing fluids, over-the-counter analgesics, developing toileting schedule, and avoiding caffeine.  Patient was given strict ER follow-up precautions.  Patient was advised that if the results of the culture are negative, she will be contacted and advised to stop the antibiotic prescribed today.  Patient advised to follow-up with her PCP if the culture result is negative and she is  continuing to experience symptoms.  Patient was in agreement with this plan of care and verbalized understanding.  All questions were answered.  Patient stable for discharge.  Final Clinical Impressions(s) / UC Diagnoses   Final diagnoses:  UTI symptoms     Discharge Instructions      -Urine culture is pending.  You will be contacted if the pending test result is abnormal.  You may also be contacted and advised to stop the antibiotic prescribed today if the culture result is negative.  You also have access to your results via MyChart. -Take medications as prescribed. -Increase fluids. -Ibuprofen or Tylenol for pain, fever, or general discomfort. -Develop a toileting schedule that will allow you to toilet at least every 2 hours. -Avoid caffeine to include tea, soda, and coffee. -If sexually active, void at least 15 to 20 minutes after sexual intercourse. -Follow-up in the emergency department if you develop worsening urinary symptoms with new symptoms of fever, chills, or other concerns.  -If your urine culture result is negative and you are continuing to experience symptoms, please follow-up with your primary care physician for further evaluation. -Follow-up as needed.     ED Prescriptions     Medication Sig Dispense Auth. Provider   nitrofurantoin, macrocrystal-monohydrate, (MACROBID) 100 MG capsule Take 1 capsule (100 mg total) by mouth 2 (two) times daily. 10 capsule Leath-Warren, Sadie Haber, NP      PDMP not  reviewed this encounter.   Abran Cantor, NP 06/20/23 1655

## 2023-06-22 LAB — URINE CULTURE

## 2023-06-25 ENCOUNTER — Other Ambulatory Visit: Payer: Self-pay | Admitting: Adult Health

## 2023-07-31 ENCOUNTER — Ambulatory Visit: Payer: BC Managed Care – PPO | Admitting: Adult Health

## 2023-07-31 ENCOUNTER — Encounter: Payer: Self-pay | Admitting: Adult Health

## 2023-07-31 VITALS — BP 138/80 | HR 86 | Ht 62.0 in | Wt 187.5 lb

## 2023-07-31 DIAGNOSIS — Z6834 Body mass index (BMI) 34.0-34.9, adult: Secondary | ICD-10-CM | POA: Diagnosis not present

## 2023-07-31 DIAGNOSIS — Z713 Dietary counseling and surveillance: Secondary | ICD-10-CM | POA: Diagnosis not present

## 2023-07-31 MED ORDER — PHENTERMINE HCL 15 MG PO CAPS: 15.0000 mg | ORAL_CAPSULE | ORAL | 1 refills | Status: DC

## 2023-07-31 NOTE — Progress Notes (Signed)
  Subjective:     Patient ID: Leslie Martin, female   DOB: [birth year], 53 y.o.   MRN: [id number]  HPI Leslie Martin is a 53 year old white female, married, sp [facility] in for weight and BP check, she started on phentermine  15 mg in November. She is walking more.     Component Value Date/Time   DIAGPAP  04/10/2023 1157    - Negative for intraepithelial lesion or malignancy (NILM)   DIAGPAP  09/15/2019 1443    - Negative for intraepithelial lesion or malignancy (NILM)   HPVHIGH Negative 04/10/2023 1157   HPVHIGH Negative 09/15/2019 1443   ADEQPAP Satisfactory for evaluation. 04/10/2023 1157   ADEQPAP Satisfactory for evaluation. 09/15/2019 1443    PCP is Dr Joshua  Review of Systems Has lost 4 lbs No complaints of headache, trouble sleeping or palpitations Reviewed past medical,surgical, social and family history. Reviewed medications and allergies.     Objective:   Physical Exam BP 138/80 (BP Location: Left Arm, Patient Position: Sitting, Cuff Size: Normal)   Pulse 86   Ht 5' 2 (1.575 m)   Wt 187 lb 8 oz (85 kg)   LMP 12/07/2010 Comment: SCH  BMI 34.29 kg/m  has lost 4 lbs   Skin warm and dry.  Lungs: clear to ausculation bilaterally. Cardiovascular: regular rate and rhythm.  Fall risk is low  Upstream - 07/31/23 1356       Pregnancy Intention Screening   Does the patient want to become pregnant in the next year? N/A    Does the patient's partner want to become pregnant in the next year? N/A    Would the patient like to discuss contraceptive options today? N/A      Contraception Wrap Up   Current Method Female Sterilization   [facility]   End Method Female Sterilization   [facility]   Contraception Counseling Provided No             Assessment:     Weight loss counseling, encounter for Continue weight loss efforts Will refill phentermine  15 mg Meds ordered this encounter  Medications   phentermine  15 MG capsule    Sig: Take 1 capsule (15 mg total) by mouth every morning.    Dispense:   30 capsule    Refill:  1    Supervising Provider:   JAYNE MINDER H [[id number]]     Body mass index 34.0-34.9, adult     Plan:     Follow up in 8 weeks for weight and BP check

## 2023-08-27 ENCOUNTER — Other Ambulatory Visit: Payer: Self-pay | Admitting: Adult Health

## 2023-09-25 ENCOUNTER — Encounter: Payer: Self-pay | Admitting: Adult Health

## 2023-09-25 ENCOUNTER — Ambulatory Visit: Payer: BC Managed Care – PPO | Admitting: Adult Health

## 2023-09-25 VITALS — BP 141/88 | HR 79 | Ht 62.0 in | Wt 189.0 lb

## 2023-09-25 DIAGNOSIS — Z713 Dietary counseling and surveillance: Secondary | ICD-10-CM

## 2023-09-25 DIAGNOSIS — Z6834 Body mass index (BMI) 34.0-34.9, adult: Secondary | ICD-10-CM | POA: Diagnosis not present

## 2023-09-25 MED ORDER — PHENTERMINE HCL 30 MG PO CAPS: 30.0000 mg | ORAL_CAPSULE | ORAL | 0 refills | Status: DC

## 2023-09-25 NOTE — Progress Notes (Signed)
  Subjective:     Patient ID: Leslie Martin, female   DOB: [birth year], 53 y.o.   MRN: [id number]  HPI Leslie Martin is a 53 year old white female,married, sp [facility] , in for weight and BP check. Has been taking phentermine 15 mg. She says she does not eat til afternoon, and probably not drinking enough water.     Component Value Date/Time   DIAGPAP  04/10/2023 1157    - Negative for intraepithelial lesion or malignancy (NILM)   DIAGPAP  09/15/2019 1443    - Negative for intraepithelial lesion or malignancy (NILM)   HPVHIGH Negative 04/10/2023 1157   HPVHIGH Negative 09/15/2019 1443   ADEQPAP Satisfactory for evaluation. 04/10/2023 1157   ADEQPAP Satisfactory for evaluation. 09/15/2019 1443   PCP is Dr Yetta Barre   Review of Systems Has gained 1.5 lbs Reviewed past medical,surgical, social and family history. Reviewed medications and allergies.     Objective:   Physical Exam BP (!) 141/88 (BP Location: Left Arm, Patient Position: Sitting, Cuff Size: Normal)   Pulse 79   Ht 5\' 2"  (1.575 m)   Wt 189 lb (85.7 kg)   LMP 12/07/2010 Comment: SCH  BMI 34.57 kg/m  gained 1.5 lbs    Skin warm and dry.  Lungs: clear to ausculation bilaterally. Cardiovascular: regular rate and rhythm.  Upstream - 09/25/23 1359       Pregnancy Intention Screening   Does the patient want to become pregnant in the next year? N/A    Does the patient's partner want to become pregnant in the next year? N/A    Would the patient like to discuss contraceptive options today? N/A      Contraception Wrap Up   Current Method Female Sterilization   [facility]   End Method Female Sterilization   [facility]   Contraception Counseling Provided No              Assessment:     1. Weight loss counseling, encounter for (Primary) Will increase phentermine to 30 mg 1 daily Try to increase water Meds ordered this encounter  Medications   phentermine 30 MG capsule    Sig: Take 1 capsule (30 mg total) by mouth every morning.    Dispense:  30  capsule    Refill:  0    Supervising Provider:   Duane Lope H [[id number]]     2. Body mass index 34.0-34.9, adult     Plan:     Follow up in 4 weeks for weight and BP check

## 2023-10-23 ENCOUNTER — Encounter: Payer: Self-pay | Admitting: Adult Health

## 2023-10-23 ENCOUNTER — Ambulatory Visit: Admitting: Adult Health

## 2023-10-23 VITALS — BP 150/80 | HR 98 | Ht 62.0 in | Wt 186.5 lb

## 2023-10-23 DIAGNOSIS — Z713 Dietary counseling and surveillance: Secondary | ICD-10-CM | POA: Diagnosis not present

## 2023-10-23 DIAGNOSIS — Z6834 Body mass index (BMI) 34.0-34.9, adult: Secondary | ICD-10-CM | POA: Diagnosis not present

## 2023-10-23 MED ORDER — PHENTERMINE HCL 30 MG PO CAPS: 30.0000 mg | ORAL_CAPSULE | ORAL | 1 refills | Status: DC

## 2023-10-23 NOTE — Progress Notes (Signed)
  Subjective:     Patient ID: Leslie Martin, female   DOB: October 15, 1970, 53 y.o.   MRN: [id number]  HPI Leslie Martin is a 53 year old white female, married, sp [facility], in for weight and BP check.   Review of Systems     Objective:   Physical Exam BP (!) 150/80 (BP Location: Left Arm, Patient Position: Sitting, Cuff Size: Normal)   Pulse 98   Ht 5\' 2"  (1.575 m)   Wt 186 lb 8 oz (84.6 kg)   LMP 12/07/2010 Comment: SCH  BMI 34.11 kg/m    lost 2.5 lbs for total of about 13 lbs   Skin warm and dry.  Lungs: clear to ausculation bilaterally. Cardiovascular: regular rate and rhythm.   Upstream - 10/23/23 1357       Pregnancy Intention Screening   Does the patient want to become pregnant in the next year? N/A    Does the patient's partner want to become pregnant in the next year? N/A    Would the patient like to discuss contraceptive options today? N/A      Contraception Wrap Up   Current Method Female Sterilization   [facility]   End Method Female Sterilization   [facility]   Contraception Counseling Provided No             Assessment:     1. Weight loss counseling, encounter for (Primary) She is getting 10,000 steps in  Drinking water Will refill phentermine 30 mg  Meds ordered this encounter  Medications   phentermine 30 MG capsule    Sig: Take 1 capsule (30 mg total) by mouth every morning.    Dispense:  30 capsule    Refill:  1    Supervising Provider:   Duane Lope H [[id number]]     2. Body mass index 34.0-34.9, adult     Plan:    Message me weight and BP in 4 weeks  Follow up in 8 weeks for weight and BP check

## 2023-11-13 ENCOUNTER — Other Ambulatory Visit: Payer: Self-pay | Admitting: Adult Health

## 2023-12-18 ENCOUNTER — Encounter: Payer: Self-pay | Admitting: Adult Health

## 2023-12-18 ENCOUNTER — Ambulatory Visit: Admitting: Adult Health

## 2023-12-18 VITALS — BP 148/85 | HR 105 | Ht 62.0 in | Wt 178.5 lb

## 2023-12-18 DIAGNOSIS — Z713 Dietary counseling and surveillance: Secondary | ICD-10-CM

## 2023-12-18 DIAGNOSIS — R030 Elevated blood-pressure reading, without diagnosis of hypertension: Secondary | ICD-10-CM | POA: Insufficient documentation

## 2023-12-18 DIAGNOSIS — Z79818 Long term (current) use of other agents affecting estrogen receptors and estrogen levels: Secondary | ICD-10-CM | POA: Diagnosis not present

## 2023-12-18 DIAGNOSIS — Z6832 Body mass index (BMI) 32.0-32.9, adult: Secondary | ICD-10-CM | POA: Diagnosis not present

## 2023-12-18 MED ORDER — PHENTERMINE HCL 30 MG PO CAPS: 30.0000 mg | ORAL_CAPSULE | ORAL | 1 refills | Status: DC

## 2023-12-18 NOTE — Progress Notes (Signed)
  Subjective:     Patient ID: Leslie Martin, female   DOB: 1971-04-03, 53 y.o.   MRN: [id number]  HPI Leslie Martin is a 53 year old white female, married, sp [facility] in for weight and BP check, she has lost 8 lbs since April 1. 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     Component Value Date/Time   DIAGPAP  04/10/2023 1157    - Negative for intraepithelial lesion or malignancy (NILM)   DIAGPAP  09/15/2019 1443    - Negative for intraepithelial lesion or malignancy (NILM)   HPVHIGH Negative 04/10/2023 1157   HPVHIGH Negative 09/15/2019 1443   ADEQPAP Satisfactory for evaluation. 04/10/2023 1157   ADEQPAP Satisfactory for evaluation. 09/15/2019 1443   PCP is Dr Rochelle Chu   Review of Systems Has lost weight No complaints of headache or not sleeping well Reviewed past medical,surgical, social and family history. Reviewed medications and allergies.     Objective:   Physical Exam BP (!) 148/85 (BP Location: Left Arm, Patient Position: Sitting, Cuff Size: Normal)   Pulse (!) 105   Ht 5\' 2"  (1.575 m)   Wt 178 lb 8 oz (81 kg)   LMP 12/07/2010 Comment: SCH  BMI 32.65 kg/m     Skin warm and dry. Lungs: clear to ausculation bilaterally. Cardiovascular: regular rate and rhythm.  Fall risk is low  Upstream - 12/18/23 1422       Pregnancy Intention Screening   Does the patient want to become pregnant in the next year? N/A    Does the patient's partner want to become pregnant in the next year? N/A    Would the patient like to discuss contraceptive options today? N/A      Contraception Wrap Up   Current Method Female Sterilization   [facility]   End Method Female Sterilization   [facility]   Contraception Counseling Provided No             Assessment:     1. Weight loss counseling, encounter for (Primary) Continue weight loss efforts Will refill phentermine  30 mg  Meds ordered this encounter  Medications   phentermine  30 MG capsule    Sig: Take 1 capsule (30 mg total) by mouth every morning.    Dispense:  30  capsule    Refill:  1    Supervising Provider:   Evalyn Hillier H [[id number]]     2. Body mass index 32.0-32.9, adult  3. Current use of estrogen therapy On estratest  4. Elevated BP without diagnosis of hypertension Keep check on BP    Plan:     Follow up about 02/06/24 for weight and BP check

## 2023-12-27 ENCOUNTER — Other Ambulatory Visit: Payer: Self-pay | Admitting: Adult Health

## 2024-02-05 ENCOUNTER — Ambulatory Visit: Admitting: Adult Health

## 2024-02-05 ENCOUNTER — Encounter: Payer: Self-pay | Admitting: Adult Health

## 2024-02-05 VITALS — BP 136/83 | HR 91 | Ht 62.0 in | Wt 176.0 lb

## 2024-02-05 DIAGNOSIS — Z6832 Body mass index (BMI) 32.0-32.9, adult: Secondary | ICD-10-CM | POA: Diagnosis not present

## 2024-02-05 DIAGNOSIS — Z713 Dietary counseling and surveillance: Secondary | ICD-10-CM

## 2024-02-05 MED ORDER — PHENTERMINE HCL 30 MG PO CAPS: 30.0000 mg | ORAL_CAPSULE | ORAL | 1 refills | Status: AC

## 2024-02-05 NOTE — Progress Notes (Signed)
  Subjective:     Patient ID: Leslie Martin, female   DOB: 1970-12-12, 53 y.o.   MRN: [id number]  HPI Leslie Martin is a 53 year old white female,married, sp [facility] in for weight and BP check. She just got back from confernence in [place].     Component Value Date/Time   DIAGPAP  04/10/2023 1157    - Negative for intraepithelial lesion or malignancy (NILM)   DIAGPAP  09/15/2019 1443    - Negative for intraepithelial lesion or malignancy (NILM)   HPVHIGH Negative 04/10/2023 1157   HPVHIGH Negative 09/15/2019 1443   ADEQPAP Satisfactory for evaluation. 04/10/2023 1157   ADEQPAP Satisfactory for evaluation. 09/15/2019 1443    PCP is Dr Joshua   Review of Systems Doing well no complaints of headaches,or trouble sleeping    Has some constipation, will drink more water Reviewed past medical,surgical, social and family history. Reviewed medications and allergies.  Objective:   Physical Exam BP 136/83 (BP Location: Right Arm, Patient Position: Sitting, Cuff Size: Normal)   Pulse 91   Ht 5' 2 (1.575 m)   Wt 176 lb (79.8 kg)   LMP 12/07/2010 Comment: SCH  BMI 32.19 kg/m  lost 2.5 more lbs    Skin warm and dry.  Lungs: clear to ausculation bilaterally. Cardiovascular: regular rate and rhythm.   Upstream - 02/05/24 0856       Pregnancy Intention Screening   Does the patient want to become pregnant in the next year? N/A    Does the patient's partner want to become pregnant in the next year? N/A    Would the patient like to discuss contraceptive options today? N/A      Contraception Wrap Up   Current Method Female Sterilization   [facility]   End Method Female Sterilization   [facility]   Contraception Counseling Provided No          Assessment:     1. Weight loss counseling, encounter for (Primary) Increase water Try eating more protein Will refill phentermine  30 mg 1 daily Meds ordered this encounter  Medications   phentermine  30 MG capsule    Sig: Take 1 capsule (30 mg total) by mouth every  morning.    Dispense:  30 capsule    Refill:  1    Supervising Provider:   JAYNE MINDER H [[id number]]     2. Body mass index 32.0-32.9, adult      Plan:     Follow up in 8 weeks for weight and BP check

## 2024-04-01 ENCOUNTER — Ambulatory Visit: Admitting: Adult Health

## 2024-04-01 ENCOUNTER — Encounter: Payer: Self-pay | Admitting: Adult Health

## 2024-04-01 VITALS — BP 134/85 | HR 84 | Ht 62.0 in | Wt 175.0 lb

## 2024-04-01 DIAGNOSIS — Z6832 Body mass index (BMI) 32.0-32.9, adult: Secondary | ICD-10-CM | POA: Diagnosis not present

## 2024-04-01 DIAGNOSIS — Z713 Dietary counseling and surveillance: Secondary | ICD-10-CM

## 2024-04-01 NOTE — Progress Notes (Signed)
  Subjective:     Patient ID: Leslie Martin, female   DOB: [birth year], 53 y.o.   MRN: [id number]  HPI Leslie Martin is a 53 year old white female, married, sp [facility] in for weight and BP check. She has lost about 16 lbs total, on phentermine  on and off.     Component Value Date/Time   DIAGPAP  04/10/2023 1157    - Negative for intraepithelial lesion or malignancy (NILM)   DIAGPAP  09/15/2019 1443    - Negative for intraepithelial lesion or malignancy (NILM)   HPVHIGH Negative 04/10/2023 1157   HPVHIGH Negative 09/15/2019 1443   ADEQPAP Satisfactory for evaluation. 04/10/2023 1157   ADEQPAP Satisfactory for evaluation. 09/15/2019 1443    PCP is Dr Joshua Review of Systems +weight loss Reviewed past medical,surgical, social and family history. Reviewed medications and allergies.     Objective:   Physical Exam BP 134/85 (BP Location: Right Arm, Patient Position: Sitting, Cuff Size: Normal)   Pulse 84   Ht 5' 2 (1.575 m)   Wt 175 lb (79.4 kg)   LMP 12/07/2010 Comment: SCH  BMI 32.01 kg/m     Skin warm and dry. Lungs: clear to ausculation bilaterally. Cardiovascular: regular rate and rhythm.  Fall risk is low  Upstream - 04/01/24 0840       Pregnancy Intention Screening   Does the patient want to become pregnant in the next year? N/A    Does the patient's partner want to become pregnant in the next year? N/A    Would the patient like to discuss contraceptive options today? N/A      Contraception Wrap Up   Current Method Female Sterilization   [facility]   End Method Female Sterilization   [facility]   Contraception Counseling Provided No          Assessment:     1. Weight loss counseling, encounter for (Primary) Continue weight loss efforts but will stop phentermine  for now   2. Body mass index 32.0-32.9, adult     Plan:     Follow up prn

## 2024-05-05 ENCOUNTER — Telehealth: Payer: Self-pay | Admitting: Adult Health

## 2024-05-05 ENCOUNTER — Other Ambulatory Visit: Payer: Self-pay | Admitting: Adult Health

## 2024-05-05 NOTE — Telephone Encounter (Signed)
 She wants estratest refilled, will send in

## 2024-06-09 ENCOUNTER — Other Ambulatory Visit: Payer: Self-pay | Admitting: Adult Health
